# Patient Record
Sex: Male | Born: 1970 | Race: White | Hispanic: No | Marital: Married
Health system: Southern US, Community
[De-identification: ages and names within clinical notes are randomized; demographics above are authoritative.]

---

## 2017-08-01 ENCOUNTER — Inpatient Hospital Stay
Admission: RE | Admit: 2017-08-01 | Discharge: 2017-08-02 | Disposition: A | Discharge status: Still In Hospital | Payer: Medicaid Other | Source: Other Acute Inpatient Hospital | Attending: Internal Medicine | Admitting: Internal Medicine

## 2017-08-01 DIAGNOSIS — T85598A Other mechanical complication of other gastrointestinal prosthetic devices, implants and grafts, initial encounter: Secondary | ICD-10-CM

## 2017-08-01 DIAGNOSIS — R0603 Acute respiratory distress: Secondary | ICD-10-CM

## 2017-08-01 LAB — BLOOD GAS, ARTERIAL
Acid-Base Excess: 11.8 mmol/L — ABNORMAL HIGH (ref 0.0–2.0)
Bicarbonate: 38.1 mmol/L — ABNORMAL HIGH (ref 20.0–28.0)
FIO2: 30
O2 Saturation: 96 %
PEEP: 5 cmH2O
Patient temperature: 98.6
RATE: 18 resp/min
VT: 450 mL
pCO2 arterial: 75.3 mmHg (ref 32.0–48.0)
pH, Arterial: 7.325 — ABNORMAL LOW (ref 7.350–7.450)
pO2, Arterial: 87.7 mmHg (ref 83.0–108.0)

## 2017-08-02 ENCOUNTER — Other Ambulatory Visit (HOSPITAL_COMMUNITY): Payer: Medicaid Other

## 2017-08-02 ENCOUNTER — Inpatient Hospital Stay (HOSPITAL_COMMUNITY)
Admission: AD | Admit: 2017-08-02 | Discharge: 2017-08-11 | DRG: 907 | Disposition: E | Payer: Medicare Other | Source: Other Acute Inpatient Hospital | Attending: Pulmonary Disease | Admitting: Pulmonary Disease

## 2017-08-02 ENCOUNTER — Encounter: Payer: Medicaid Other | Admitting: Certified Registered"

## 2017-08-02 ENCOUNTER — Inpatient Hospital Stay (HOSPITAL_COMMUNITY): Payer: Medicare Other

## 2017-08-02 DIAGNOSIS — J9601 Acute respiratory failure with hypoxia: Secondary | ICD-10-CM | POA: Diagnosis not present

## 2017-08-02 DIAGNOSIS — R6521 Severe sepsis with septic shock: Secondary | ICD-10-CM | POA: Diagnosis not present

## 2017-08-02 DIAGNOSIS — Y828 Other medical devices associated with adverse incidents: Secondary | ICD-10-CM | POA: Diagnosis present

## 2017-08-02 DIAGNOSIS — Z66 Do not resuscitate: Secondary | ICD-10-CM | POA: Diagnosis not present

## 2017-08-02 DIAGNOSIS — G931 Anoxic brain damage, not elsewhere classified: Secondary | ICD-10-CM

## 2017-08-02 DIAGNOSIS — Z515 Encounter for palliative care: Secondary | ICD-10-CM | POA: Diagnosis not present

## 2017-08-02 DIAGNOSIS — L8990 Pressure ulcer of unspecified site, unspecified stage: Secondary | ICD-10-CM

## 2017-08-02 DIAGNOSIS — Z6841 Body Mass Index (BMI) 40.0 and over, adult: Secondary | ICD-10-CM | POA: Diagnosis not present

## 2017-08-02 DIAGNOSIS — T85628A Displacement of other specified internal prosthetic devices, implants and grafts, initial encounter: Principal | ICD-10-CM | POA: Diagnosis present

## 2017-08-02 DIAGNOSIS — A419 Sepsis, unspecified organism: Secondary | ICD-10-CM | POA: Diagnosis not present

## 2017-08-02 DIAGNOSIS — E119 Type 2 diabetes mellitus without complications: Secondary | ICD-10-CM | POA: Diagnosis present

## 2017-08-02 DIAGNOSIS — Z9289 Personal history of other medical treatment: Secondary | ICD-10-CM

## 2017-08-02 DIAGNOSIS — I2781 Cor pulmonale (chronic): Secondary | ICD-10-CM | POA: Diagnosis present

## 2017-08-02 DIAGNOSIS — N179 Acute kidney failure, unspecified: Secondary | ICD-10-CM | POA: Diagnosis not present

## 2017-08-02 DIAGNOSIS — E662 Morbid (severe) obesity with alveolar hypoventilation: Secondary | ICD-10-CM | POA: Diagnosis present

## 2017-08-02 DIAGNOSIS — J80 Acute respiratory distress syndrome: Secondary | ICD-10-CM | POA: Diagnosis present

## 2017-08-02 DIAGNOSIS — I110 Hypertensive heart disease with heart failure: Secondary | ICD-10-CM | POA: Diagnosis present

## 2017-08-02 DIAGNOSIS — E870 Hyperosmolality and hypernatremia: Secondary | ICD-10-CM | POA: Diagnosis not present

## 2017-08-02 DIAGNOSIS — T82538A Leakage of other cardiac and vascular devices and implants, initial encounter: Secondary | ICD-10-CM

## 2017-08-02 DIAGNOSIS — I469 Cardiac arrest, cause unspecified: Secondary | ICD-10-CM | POA: Diagnosis present

## 2017-08-02 DIAGNOSIS — Z930 Tracheostomy status: Secondary | ICD-10-CM

## 2017-08-02 DIAGNOSIS — J449 Chronic obstructive pulmonary disease, unspecified: Secondary | ICD-10-CM | POA: Diagnosis present

## 2017-08-02 DIAGNOSIS — I4891 Unspecified atrial fibrillation: Secondary | ICD-10-CM | POA: Diagnosis present

## 2017-08-02 DIAGNOSIS — E875 Hyperkalemia: Secondary | ICD-10-CM | POA: Diagnosis not present

## 2017-08-02 DIAGNOSIS — Z9911 Dependence on respirator [ventilator] status: Secondary | ICD-10-CM | POA: Diagnosis not present

## 2017-08-02 DIAGNOSIS — R570 Cardiogenic shock: Secondary | ICD-10-CM | POA: Diagnosis present

## 2017-08-02 DIAGNOSIS — J9600 Acute respiratory failure, unspecified whether with hypoxia or hypercapnia: Secondary | ICD-10-CM

## 2017-08-02 DIAGNOSIS — I509 Heart failure, unspecified: Secondary | ICD-10-CM | POA: Diagnosis present

## 2017-08-02 DIAGNOSIS — Z452 Encounter for adjustment and management of vascular access device: Secondary | ICD-10-CM

## 2017-08-02 LAB — COMPREHENSIVE METABOLIC PANEL
ALK PHOS: 96 U/L (ref 38–126)
ALT: 26 U/L (ref 17–63)
AST: 47 U/L — AB (ref 15–41)
Albumin: 2.9 g/dL — ABNORMAL LOW (ref 3.5–5.0)
Anion gap: 16 — ABNORMAL HIGH (ref 5–15)
BUN: 50 mg/dL — AB (ref 6–20)
CALCIUM: 9.2 mg/dL (ref 8.9–10.3)
CO2: 30 mmol/L (ref 22–32)
CREATININE: 1.39 mg/dL — AB (ref 0.61–1.24)
Chloride: 101 mmol/L (ref 101–111)
GFR calc non Af Amer: 59 mL/min — ABNORMAL LOW (ref >60)
Glucose, Bld: 299 mg/dL — ABNORMAL HIGH (ref 65–99)
Potassium: 5.6 mmol/L — ABNORMAL HIGH (ref 3.5–5.1)
SODIUM: 147 mmol/L — AB (ref 135–145)
Total Bilirubin: 1.5 mg/dL — ABNORMAL HIGH (ref 0.3–1.2)
Total Protein: 8.7 g/dL — ABNORMAL HIGH (ref 6.5–8.1)

## 2017-08-02 LAB — DIFFERENTIAL
BASOS ABS: 0 10*3/uL (ref 0.0–0.1)
Basophils Relative: 0 %
EOS PCT: 0 %
Eosinophils Absolute: 0 10*3/uL (ref 0.0–0.7)
LYMPHS ABS: 1.9 10*3/uL (ref 0.7–4.0)
Lymphocytes Relative: 10 %
MONOS PCT: 6 %
Monocytes Absolute: 1.1 10*3/uL — ABNORMAL HIGH (ref 0.1–1.0)
Neutro Abs: 16.1 10*3/uL — ABNORMAL HIGH (ref 1.7–7.7)
Neutrophils Relative %: 84 %

## 2017-08-02 LAB — BLOOD GAS, ARTERIAL
Acid-Base Excess: 12.6 mmol/L — ABNORMAL HIGH (ref 0.0–2.0)
BICARBONATE: 38.4 mmol/L — AB (ref 20.0–28.0)
FIO2: 0.3
LHR: 20 {breaths}/min
MECHVT: 500 mL
O2 Saturation: 92.4 %
PCO2 ART: 69.1 mmHg — AB (ref 32.0–48.0)
PEEP: 5 cmH2O
PO2 ART: 68.4 mmHg — AB (ref 83.0–108.0)
Patient temperature: 98.6
pH, Arterial: 7.364 (ref 7.350–7.450)

## 2017-08-02 LAB — POCT I-STAT, CHEM 8
BUN: 51 mg/dL — ABNORMAL HIGH (ref 6–20)
BUN: 50 mg/dL — ABNORMAL HIGH (ref 6–20)
BUN: 49 mg/dL — AB (ref 6–20)
CALCIUM ION: 1.16 mmol/L (ref 1.15–1.40)
CALCIUM ION: 1.15 mmol/L (ref 1.15–1.40)
CALCIUM ION: 1.15 mmol/L (ref 1.15–1.40)
CREATININE: 1.5 mg/dL — AB (ref 0.61–1.24)
Chloride: 101 mmol/L (ref 101–111)
Chloride: 101 mmol/L (ref 101–111)
Chloride: 101 mmol/L (ref 101–111)
Creatinine, Ser: 1.5 mg/dL — ABNORMAL HIGH (ref 0.61–1.24)
Creatinine, Ser: 1.5 mg/dL — ABNORMAL HIGH (ref 0.61–1.24)
GLUCOSE: 263 mg/dL — AB (ref 65–99)
GLUCOSE: 268 mg/dL — AB (ref 65–99)
GLUCOSE: 264 mg/dL — AB (ref 65–99)
HCT: 25 % — ABNORMAL LOW (ref 39.0–52.0)
HCT: 24 % — ABNORMAL LOW (ref 39.0–52.0)
HCT: 25 % — ABNORMAL LOW (ref 39.0–52.0)
HEMOGLOBIN: 8.5 g/dL — AB (ref 13.0–17.0)
HEMOGLOBIN: 8.2 g/dL — AB (ref 13.0–17.0)
Hemoglobin: 8.5 g/dL — ABNORMAL LOW (ref 13.0–17.0)
Potassium: 5.1 mmol/L (ref 3.5–5.1)
Potassium: 4.8 mmol/L (ref 3.5–5.1)
Potassium: 4.4 mmol/L (ref 3.5–5.1)
Sodium: 146 mmol/L — ABNORMAL HIGH (ref 135–145)
Sodium: 147 mmol/L — ABNORMAL HIGH (ref 135–145)
Sodium: 147 mmol/L — ABNORMAL HIGH (ref 135–145)
TCO2: 37 mmol/L — ABNORMAL HIGH (ref 22–32)
TCO2: 34 mmol/L — ABNORMAL HIGH (ref 22–32)
TCO2: 34 mmol/L — ABNORMAL HIGH (ref 22–32)

## 2017-08-02 LAB — BASIC METABOLIC PANEL
Anion gap: 11 (ref 5–15)
Anion gap: 12 (ref 5–15)
BUN: 11 mg/dL (ref 6–20)
BUN: 66 mg/dL — AB (ref 6–20)
CALCIUM: 8.7 mg/dL — AB (ref 8.9–10.3)
CO2: 22 mmol/L (ref 22–32)
CO2: 31 mmol/L (ref 22–32)
CREATININE: 0.63 mg/dL (ref 0.61–1.24)
Calcium: 8.8 mg/dL — ABNORMAL LOW (ref 8.9–10.3)
Chloride: 107 mmol/L (ref 101–111)
Chloride: 102 mmol/L (ref 101–111)
Creatinine, Ser: 1.64 mg/dL — ABNORMAL HIGH (ref 0.61–1.24)
GFR calc Af Amer: >60 mL/min (ref >60)
GFR calc Af Amer: 56 mL/min — ABNORMAL LOW (ref >60)
GFR, EST NON AFRICAN AMERICAN: 48 mL/min — AB (ref >60)
GLUCOSE: 92 mg/dL (ref 65–99)
GLUCOSE: 241 mg/dL — AB (ref 65–99)
POTASSIUM: 4.2 mmol/L (ref 3.5–5.1)
Potassium: 3.7 mmol/L (ref 3.5–5.1)
Sodium: 140 mmol/L (ref 135–145)
Sodium: 145 mmol/L (ref 135–145)

## 2017-08-02 LAB — CBC WITH DIFFERENTIAL/PLATELET
BAND NEUTROPHILS: 1 %
BASOS ABS: 0 10*3/uL (ref 0.0–0.1)
Basophils Relative: 0 %
Blasts: 0 %
EOS ABS: 0 10*3/uL (ref 0.0–0.7)
Eosinophils Relative: 0 %
HCT: 31.8 % — ABNORMAL LOW (ref 39.0–52.0)
HEMOGLOBIN: 8.2 g/dL — AB (ref 13.0–17.0)
Lymphocytes Relative: 16 %
Lymphs Abs: 3.2 10*3/uL (ref 0.7–4.0)
MCH: 25.7 pg — ABNORMAL LOW (ref 26.0–34.0)
MCHC: 25.8 g/dL — ABNORMAL LOW (ref 30.0–36.0)
MCV: 99.7 fL (ref 78.0–100.0)
METAMYELOCYTES PCT: 3 %
MONO ABS: 1 10*3/uL (ref 0.1–1.0)
MYELOCYTES: 3 %
Monocytes Relative: 5 %
NEUTROS PCT: 67 %
NRBC: 0 /100{WBCs}
Neutro Abs: 15 10*3/uL — ABNORMAL HIGH (ref 1.7–7.7)
Other: 5 %
PLATELETS: UNDETERMINED 10*3/uL (ref 150–400)
PROMYELOCYTES ABS: 0 %
RBC: 3.19 MIL/uL — ABNORMAL LOW (ref 4.22–5.81)
RDW: 17.2 % — ABNORMAL HIGH (ref 11.5–15.5)
WBC: 20.3 10*3/uL — ABNORMAL HIGH (ref 4.0–10.5)

## 2017-08-02 LAB — GLUCOSE, CAPILLARY
GLUCOSE-CAPILLARY: 254 mg/dL — AB (ref 65–99)
GLUCOSE-CAPILLARY: 245 mg/dL — AB (ref 65–99)
GLUCOSE-CAPILLARY: 220 mg/dL — AB (ref 65–99)
GLUCOSE-CAPILLARY: 213 mg/dL — AB (ref 65–99)
Glucose-Capillary: 201 mg/dL — ABNORMAL HIGH (ref 65–99)
Glucose-Capillary: 222 mg/dL — ABNORMAL HIGH (ref 65–99)
Glucose-Capillary: 223 mg/dL — ABNORMAL HIGH (ref 65–99)
Glucose-Capillary: 224 mg/dL — ABNORMAL HIGH (ref 65–99)
Glucose-Capillary: 234 mg/dL — ABNORMAL HIGH (ref 65–99)
Glucose-Capillary: 234 mg/dL — ABNORMAL HIGH (ref 65–99)
Glucose-Capillary: 249 mg/dL — ABNORMAL HIGH (ref 65–99)

## 2017-08-02 LAB — URINALYSIS, ROUTINE W REFLEX MICROSCOPIC
GLUCOSE, UA: NEGATIVE mg/dL
Ketones, ur: 15 mg/dL — AB
Nitrite: NEGATIVE
PROTEIN: 100 mg/dL — AB
SPECIFIC GRAVITY, URINE: 1.02 (ref 1.005–1.030)
pH: 5.5 (ref 5.0–8.0)

## 2017-08-02 LAB — POCT I-STAT 4, (NA,K, GLUC, HGB,HCT)
GLUCOSE: 245 mg/dL — AB (ref 65–99)
HEMATOCRIT: 26 % — AB (ref 39.0–52.0)
HEMOGLOBIN: 8.8 g/dL — AB (ref 13.0–17.0)
POTASSIUM: 5.2 mmol/L — AB (ref 3.5–5.1)
SODIUM: 148 mmol/L — AB (ref 135–145)

## 2017-08-02 LAB — POCT I-STAT 3, ART BLOOD GAS (G3+)
Acid-Base Excess: 10 mmol/L — ABNORMAL HIGH (ref 0.0–2.0)
BICARBONATE: 38.9 mmol/L — AB (ref 20.0–28.0)
O2 Saturation: 99 %
PCO2 ART: 86.8 mmHg — AB (ref 32.0–48.0)
PO2 ART: 158 mmHg — AB (ref 83.0–108.0)
Patient temperature: 36.1
TCO2: 42 mmol/L — ABNORMAL HIGH (ref 22–32)
pH, Arterial: 7.255 — ABNORMAL LOW (ref 7.350–7.450)

## 2017-08-02 LAB — CBC
HCT: 28.9 % — ABNORMAL LOW (ref 39.0–52.0)
Hemoglobin: 7.8 g/dL — ABNORMAL LOW (ref 13.0–17.0)
MCH: 26.2 pg (ref 26.0–34.0)
MCHC: 27 g/dL — ABNORMAL LOW (ref 30.0–36.0)
MCV: 97 fL (ref 78.0–100.0)
Platelets: 116 10*3/uL — ABNORMAL LOW (ref 150–400)
RBC: 2.98 MIL/uL — ABNORMAL LOW (ref 4.22–5.81)
RDW: 17.2 % — AB (ref 11.5–15.5)
WBC: 19.1 10*3/uL — ABNORMAL HIGH (ref 4.0–10.5)

## 2017-08-02 LAB — APTT
APTT: 28 s (ref 24–36)
aPTT: 30 seconds (ref 24–36)

## 2017-08-02 LAB — URINALYSIS, MICROSCOPIC (REFLEX)

## 2017-08-02 LAB — PROTIME-INR
INR: 1.43
INR: 1.46
INR: 1.6
PROTHROMBIN TIME: 17.3 s — AB (ref 11.4–15.2)
PROTHROMBIN TIME: 18.9 s — AB (ref 11.4–15.2)
Prothrombin Time: 17.6 seconds — ABNORMAL HIGH (ref 11.4–15.2)

## 2017-08-02 LAB — LACTIC ACID, PLASMA: LACTIC ACID, VENOUS: 3.1 mmol/L — AB (ref 0.5–1.9)

## 2017-08-02 LAB — CK TOTAL AND CKMB (NOT AT ARMC)
CK, MB: 3.7 ng/mL (ref 0.5–5.0)
Relative Index: INVALID (ref 0.0–2.5)
Total CK: 67 U/L (ref 49–397)

## 2017-08-02 LAB — TROPONIN I
TROPONIN I: 0.1 ng/mL — AB (ref <0.03)
Troponin I: 0.06 ng/mL (ref <0.03)

## 2017-08-02 LAB — HIV ANTIBODY (ROUTINE TESTING W REFLEX): HIV SCREEN 4TH GENERATION: NONREACTIVE

## 2017-08-02 LAB — MRSA PCR SCREENING: MRSA BY PCR: NEGATIVE

## 2017-08-02 LAB — HEMOGLOBIN A1C
Hgb A1c MFr Bld: 6.1 % — ABNORMAL HIGH (ref 4.8–5.6)
MEAN PLASMA GLUCOSE: 128.37 mg/dL

## 2017-08-02 LAB — MAGNESIUM: MAGNESIUM: 3.5 mg/dL — AB (ref 1.7–2.4)

## 2017-08-02 MED ORDER — INSULIN LISPRO 100 UNIT/ML ~~LOC~~ SOLN
SUBCUTANEOUS | Status: DC
Start: 2017-08-01 — End: 2017-08-02

## 2017-08-02 MED ORDER — FENTANYL CITRATE (PF) 2500 MCG/50ML IJ SOLN
INTRAMUSCULAR | Status: DC
Start: ? — End: 2017-08-02

## 2017-08-02 MED ORDER — GENERIC EXTERNAL MEDICATION
Status: DC
Start: ? — End: 2017-08-02

## 2017-08-02 MED ORDER — SODIUM CHLORIDE 0.9 % IV SOLN
INTRAVENOUS | Status: DC
  Administered 2017-08-02 (×2): via INTRAVENOUS

## 2017-08-02 MED ORDER — SODIUM CHLORIDE 0.9% FLUSH
10.0000 mL | Freq: Two times a day (BID) | INTRAVENOUS | Status: DC
  Administered 2017-08-03 – 2017-08-06 (×6): 10 mL

## 2017-08-02 MED ORDER — ACETAMINOPHEN 160 MG/5ML PO LIQD
650.00 | ORAL | Status: DC
Start: ? — End: 2017-08-02

## 2017-08-02 MED ORDER — POTASSIUM CHLORIDE 20 MEQ/100ML IV SOLN
INTRAVENOUS | Status: DC
Start: ? — End: 2017-08-02

## 2017-08-02 MED ORDER — SODIUM CHLORIDE 0.9 % IV SOLN
INTRAVENOUS | Status: DC
  Administered 2017-08-02: 2 [IU]/h via INTRAVENOUS
  Administered 2017-08-02: 14.7 [IU]/h via INTRAVENOUS
  Administered 2017-08-03: 13.1 [IU]/h via INTRAVENOUS
  Filled 2017-08-02 (×3): qty 1

## 2017-08-02 MED ORDER — HEPARIN SODIUM (PORCINE) 5000 UNIT/ML IJ SOLN
5000.0000 [IU] | Freq: Three times a day (TID) | INTRAMUSCULAR | Status: DC
  Administered 2017-08-02 – 2017-08-06 (×13): 5000 [IU] via SUBCUTANEOUS
  Filled 2017-08-02 (×13): qty 1

## 2017-08-02 MED ORDER — ACETAMINOPHEN 325 MG PO TABS
650.00 | ORAL_TABLET | ORAL | Status: DC
Start: ? — End: 2017-08-02

## 2017-08-02 MED ORDER — MIDAZOLAM HCL 2 MG/2ML IJ SOLN: 1.0000 mg | Freq: Once | INTRAMUSCULAR | Status: AC

## 2017-08-02 MED ORDER — MIDAZOLAM HCL 2 MG/2ML IJ SOLN
2.0000 mg | Freq: Once | INTRAMUSCULAR | Status: AC
  Administered 2017-08-02: 2 mg via INTRAVENOUS

## 2017-08-02 MED ORDER — OXYCODONE HCL 5 MG PO TABS
5.00 | ORAL_TABLET | ORAL | Status: DC
Start: ? — End: 2017-08-02

## 2017-08-02 MED ORDER — CISATRACURIUM BOLUS VIA INFUSION
0.1000 mg/kg | Freq: Once | INTRAVENOUS | Status: AC
  Administered 2017-08-02: 13.5 mg via INTRAVENOUS
  Filled 2017-08-02: qty 14

## 2017-08-02 MED ORDER — FENTANYL CITRATE (PF) 100 MCG/2ML IJ SOLN
100.0000 ug | Freq: Once | INTRAMUSCULAR | Status: AC
  Administered 2017-08-02: 100 ug via INTRAVENOUS

## 2017-08-02 MED ORDER — BUDESONIDE 0.5 MG/2ML IN SUSP
0.5000 mg | Freq: Two times a day (BID) | RESPIRATORY_TRACT | Status: DC
  Administered 2017-08-02 – 2017-08-06 (×9): 0.5 mg via RESPIRATORY_TRACT
  Filled 2017-08-02 (×9): qty 2

## 2017-08-02 MED ORDER — INSULIN GLARGINE 100 UNIT/ML ~~LOC~~ SOLN
SUBCUTANEOUS | Status: DC
Start: ? — End: 2017-08-02

## 2017-08-02 MED ORDER — FENTANYL CITRATE (PF) 100 MCG/2ML IJ SOLN: 50.0000 ug | Freq: Once | INTRAMUSCULAR | Status: AC

## 2017-08-02 MED ORDER — ARTIFICIAL TEARS OPHTHALMIC OINT
1.0000 "application " | TOPICAL_OINTMENT | Freq: Three times a day (TID) | OPHTHALMIC | Status: DC
  Administered 2017-08-02 – 2017-08-03 (×5): 1 via OPHTHALMIC
  Filled 2017-08-02: qty 3.5

## 2017-08-02 MED ORDER — FENTANYL BOLUS VIA INFUSION
25.0000 ug | INTRAVENOUS | Status: DC | PRN
  Filled 2017-08-02: qty 25

## 2017-08-02 MED ORDER — INSULIN ASPART 100 UNIT/ML ~~LOC~~ SOLN: 0.0000 [IU] | SUBCUTANEOUS | Status: DC

## 2017-08-02 MED ORDER — IPRATROPIUM-ALBUTEROL 0.5-2.5 (3) MG/3ML IN SOLN
3.0000 mL | Freq: Four times a day (QID) | RESPIRATORY_TRACT | Status: DC
  Administered 2017-08-02 – 2017-08-06 (×18): 3 mL via RESPIRATORY_TRACT
  Filled 2017-08-02 (×18): qty 3

## 2017-08-02 MED ORDER — PANTOPRAZOLE SODIUM 40 MG IV SOLR
40.0000 mg | Freq: Every day | INTRAVENOUS | Status: DC
  Administered 2017-08-02 – 2017-08-05 (×4): 40 mg via INTRAVENOUS
  Filled 2017-08-02 (×4): qty 40

## 2017-08-02 MED ORDER — ACETAMINOPHEN 650 MG RE SUPP
650.00 | RECTAL | Status: DC
Start: ? — End: 2017-08-02

## 2017-08-02 MED ORDER — FENTANYL CITRATE-NACL 2.5-0.9 MG/250ML-% IV SOLN
INTRAVENOUS | Status: DC
Start: ? — End: 2017-08-02

## 2017-08-02 MED ORDER — CHLORHEXIDINE GLUCONATE CLOTH 2 % EX PADS
6.0000 | MEDICATED_PAD | Freq: Every day | CUTANEOUS | Status: DC
  Administered 2017-08-02 – 2017-08-06 (×3): 6 via TOPICAL

## 2017-08-02 MED ORDER — INSULIN LISPRO 100 UNIT/ML ~~LOC~~ SOLN
SUBCUTANEOUS | Status: DC
Start: ? — End: 2017-08-02

## 2017-08-02 MED ORDER — SODIUM CHLORIDE 0.9 % IV SOLN
2.0000 mg/h | INTRAVENOUS | Status: DC
  Administered 2017-08-02: 7 mg/h via INTRAVENOUS
  Administered 2017-08-02: 2 mg/h via INTRAVENOUS
  Administered 2017-08-02 – 2017-08-03 (×4): 7 mg/h via INTRAVENOUS
  Filled 2017-08-02 (×7): qty 10

## 2017-08-02 MED ORDER — SODIUM CHLORIDE 0.9 % IV SOLN
100.0000 ug/h | INTRAVENOUS | Status: DC
  Filled 2017-08-02: qty 50

## 2017-08-02 MED ORDER — FISH OIL 1000 MG PO CAPS
ORAL_CAPSULE | ORAL | Status: DC
Start: 2017-08-01 — End: 2017-08-02

## 2017-08-02 MED ORDER — CLINDAMYCIN PHOSPHATE IN D5W 900 MG/50ML IV SOLN
900.00 | INTRAVENOUS | Status: DC
Start: ? — End: 2017-08-02

## 2017-08-02 MED ORDER — BISACODYL 10 MG RE SUPP
10.00 | RECTAL | Status: DC
Start: ? — End: 2017-08-02

## 2017-08-02 MED ORDER — FENTANYL 2500MCG IN NS 250ML (10MCG/ML) PREMIX INFUSION
100.0000 ug/h | INTRAVENOUS | Status: DC
  Administered 2017-08-02: 300 ug/h via INTRAVENOUS
  Administered 2017-08-02: 100 ug/h via INTRAVENOUS
  Administered 2017-08-03 (×3): 300 ug/h via INTRAVENOUS
  Filled 2017-08-02 (×6): qty 250

## 2017-08-02 MED ORDER — SODIUM CHLORIDE 0.9 % IV SOLN
1250.0000 mg | Freq: Two times a day (BID) | INTRAVENOUS | Status: DC
  Administered 2017-08-03: 1250 mg via INTRAVENOUS
  Filled 2017-08-02 (×2): qty 1250

## 2017-08-02 MED ORDER — GENERIC EXTERNAL MEDICATION
.04 | Status: DC
Start: ? — End: 2017-08-02

## 2017-08-02 MED ORDER — NITROGLYCERIN 0.4 MG SL SUBL
0.40 | SUBLINGUAL_TABLET | SUBLINGUAL | Status: DC
Start: ? — End: 2017-08-02

## 2017-08-02 MED ORDER — SODIUM CHLORIDE 0.9 % IV SOLN
1.0000 ug/kg/min | INTRAVENOUS | Status: DC
  Administered 2017-08-02 (×2): 1 ug/kg/min via INTRAVENOUS
  Administered 2017-08-03: 1.5 ug/kg/min via INTRAVENOUS
  Filled 2017-08-02 (×4): qty 20

## 2017-08-02 MED ORDER — VANCOMYCIN HCL 10 G IV SOLR
2500.0000 mg | Freq: Once | INTRAVENOUS | Status: AC
  Administered 2017-08-02: 2500 mg via INTRAVENOUS
  Filled 2017-08-02: qty 2500

## 2017-08-02 MED ORDER — MEROPENEM 1 G IV SOLR
2.0000 g | Freq: Three times a day (TID) | INTRAVENOUS | Status: DC
  Administered 2017-08-02 – 2017-08-05 (×9): 2 g via INTRAVENOUS
  Filled 2017-08-02 (×10): qty 2

## 2017-08-02 MED ORDER — ETOMIDATE 2 MG/ML IV SOLN
20.0000 mg | Freq: Once | INTRAVENOUS | Status: AC
  Administered 2017-08-02: 20 mg via INTRAVENOUS

## 2017-08-02 MED ORDER — ROCURONIUM BROMIDE 50 MG/5ML IV SOLN
50.0000 mg | Freq: Once | INTRAVENOUS | Status: AC
  Administered 2017-08-02: 50 mg via INTRAVENOUS
  Filled 2017-08-02: qty 5

## 2017-08-02 MED ORDER — POTASSIUM CHLORIDE CRYS ER 20 MEQ PO TBCR
EXTENDED_RELEASE_TABLET | ORAL | Status: DC
Start: ? — End: 2017-08-02

## 2017-08-02 MED ORDER — ALBUTEROL SULFATE HFA 108 (90 BASE) MCG/ACT IN AERS
INHALATION_SPRAY | RESPIRATORY_TRACT | Status: DC
Start: 2017-08-01 — End: 2017-08-02

## 2017-08-02 MED ORDER — SODIUM CHLORIDE 0.9% FLUSH: 10.0000 mL | INTRAVENOUS | Status: DC | PRN

## 2017-08-02 MED ORDER — ORAL CARE MOUTH RINSE
15.0000 mL | OROMUCOSAL | Status: DC
  Administered 2017-08-02 – 2017-08-06 (×41): 15 mL via OROMUCOSAL

## 2017-08-02 MED ORDER — CHLORHEXIDINE GLUCONATE 0.12% ORAL RINSE (MEDLINE KIT)
15.0000 mL | Freq: Two times a day (BID) | OROMUCOSAL | Status: DC
  Administered 2017-08-02 – 2017-08-06 (×9): 15 mL via OROMUCOSAL

## 2017-08-02 MED ORDER — MIDAZOLAM BOLUS VIA INFUSION
1.0000 mg | INTRAVENOUS | Status: DC | PRN
  Filled 2017-08-02: qty 1

## 2017-08-02 MED ORDER — NOREPINEPHRINE BITARTRATE 1 MG/ML IV SOLN
0.0000 ug/min | INTRAVENOUS | Status: DC
  Administered 2017-08-02: 10 ug/min via INTRAVENOUS
  Administered 2017-08-02: 5 ug/min via INTRAVENOUS
  Administered 2017-08-03: 13 ug/min via INTRAVENOUS
  Filled 2017-08-02 (×3): qty 4

## 2017-08-02 MED ORDER — GENERIC EXTERNAL MEDICATION
.08 | Status: DC
Start: ? — End: 2017-08-02

## 2017-08-02 MED ORDER — CISATRACURIUM BOLUS VIA INFUSION
0.0500 mg/kg | INTRAVENOUS | Status: DC | PRN
  Filled 2017-08-02: qty 7

## 2017-08-02 MED ORDER — DILTIAZEM HCL 30 MG PO TABS
30.00 | ORAL_TABLET | ORAL | Status: DC
Start: 2017-08-01 — End: 2017-08-02

## 2017-08-02 MED ORDER — SODIUM CHLORIDE 0.9 % IV SOLN
INTRAVENOUS | Status: DC
Start: ? — End: 2017-08-02

## 2017-08-02 MED ORDER — INSULIN ASPART 100 UNIT/ML ~~LOC~~ SOLN
0.0000 [IU] | SUBCUTANEOUS | Status: DC
  Administered 2017-08-02: 5 [IU] via SUBCUTANEOUS

## 2017-08-02 MED ORDER — POTASSIUM CHLORIDE 20 MEQ/15ML (10%) PO SOLN
ORAL | Status: DC
Start: ? — End: 2017-08-02

## 2017-08-02 MED FILL — Medication: Qty: 1 | Status: AC

## 2017-08-02 NOTE — Procedures (Signed)
Bronchoscopy Procedure Note Duane Mcmahon [id number] [birth year]  Procedure: Bronchoscopy Indications: Diagnostic evaluation of the airways, Obtain specimens for culture and/or other diagnostic studies and Remove secretions  Procedure Details Consent: Unable to obtain consent because of emergent medical necessity. Time Out: Verified patient identification, verified procedure, site/side was marked, verified correct patient position, special equipment/implants available, medications/allergies/relevent history reviewed, required imaging and test results available.  Performed  In preparation for procedure, patient was given 100% FiO2 and bronchoscope lubricated. Sedation: Benzodiazepines, Muscle relaxants, Etomidate and Fentanyl  Airway entered and the following bronchi were examined: RUL, RML, RLL, LUL, LLL and Bronchi.   BAL from RML Bronchoscope removed.  , Patient placed back on 100% FiO2 at conclusion of procedure.    Evaluation Hemodynamic Status: BP stable throughout; O2 sats: stable throughout Patient's Current Condition: stable Specimens:  Sent purulent fluid Complications: No apparent complications Patient did tolerate procedure well.   [name] 07/25/2017

## 2017-08-02 NOTE — Procedures (Signed)
ELECTROENCEPHALOGRAM REPORT  Date of Study: 07/27/2017  Patient's Name: Duane Mcmahon MRN: [id number] Date of Birth: [birth year]  Referring Provider: Allyson SabalWesam Yocoub, MD  Clinical History: 47 year old male status post cardiac arrest, undergoing hypothermia protocol  Medications: Fentanyl Versed Nimbex  Technical Summary: A multichannel digital EEG recording measured by the international 10-20 system with electrodes applied with paste and impedances below 5000 ohms performed in our laboratory with EKG monitoring in an intubated and sedated patient.  Hyperventilation and photic stimulation were not performed.  The digital EEG was referentially recorded, reformatted, and digitally filtered in a variety of bipolar and referential montages for optimal display.    Description: The patient is intubated and unresponsive, undergoing hypothermia protocol with versed and fentanyl sedation during the recording. There is loss of normal background activity. The records read at a sensitivity of 3 uV/mm shows diffuse suppression of background activity. There is no spontaneous reactivity or reactivity noted with noxious stimulation. There is muscle artifact over the frontal leads. Hyperventilation and photic stimulation were not performed. There were no epileptiform discharges or electrographic seizures seen.   EKG lead was unremarkable.  Impression: This EEG is markedly abnormal due to diffuse background suppression and lack of EEG reactivity with noxious stimulation.  Clinical Correlation of the above findings indicates severe diffuse cerebral dysfunction that is non-specific in etiology and can be seen in the setting of anoxic/ischemic injury, toxic/metabolic encephalopathies, or medication effect. Clinical correlation is advised.  Shon MilletAdam Kyria Bumgardner, DO

## 2017-08-02 NOTE — H&P (Signed)
PULMONARY / CRITICAL CARE MEDICINE   Name: Duane Mcmahon MRN: [id number] DOB: Oct 15, 1970    ADMISSION DATE:  07/20/2017 CONSULTATION DATE:  Direct admit from [facility]  REFERRING MD:  Dr. Bess Harvest, [facility]  CHIEF COMPLAINT:  Cardiac Arrest   HISTORY OF PRESENT ILLNESS:   HPI obtained from medical chart review as patient is acutely encephalopathic and unable to provide history.    47 [facility] for chronic respiratory failure with hypoxia and hypercarbia, tracheostomy placement, and ventilator dependency.    Has a past medical history of morbid obesity, OSA/OHS, COPD, IDDM, HTN, Afib with RVR, and was a difficult airway at OSH.  During the morning of 1/22, patient had increasing agitation and was given Ativan.  Shortly afterwards he became difficult to ventilate and noted that his 8 XLT trach became dislodged, and patient subsequently developed bradycardia changing to PEA/ asystole.  CPR provided between 0548 - 0611 with ACLS measures with conversion to sinus tachycardia.  His tracheotomy was unable to be placed back.  A 6.5 ETT was placed in his stoma.  After ROSC, patient remained unresponsive.  PCCM called for direct admit to ICU following cardiac arrest.    Prior to cardiac arrest, patient was on fentanyl drip at 100 mcg/hr and cardizem drip at 10 mg/hr.  His baseline mental status was alert and able to follow commands and move all extremities.  His ventilator settings were AC 500/ 20/ 40%/5.    PAST MEDICAL HISTORY :  He  has no past medical history on file.  PAST SURGICAL HISTORY: He  has no past surgical history on file.  Allergies not on file  No current facility-administered medications on file prior to encounter.    No current outpatient medications on file prior to encounter.    FAMILY HISTORY:  His has no family status information on file.    SOCIAL HISTORY: He    REVIEW OF  SYSTEMS:  Unable as patient is encephalopathic and intubated  SUBJECTIVE:    VITAL SIGNS: There were no vitals taken for this visit.  HEMODYNAMICS:    VENTILATOR SETTINGS:    INTAKE / OUTPUT: No intake/output data recorded.  PHYSICAL EXAMINATION: General: 47 year old obese male with endotracheal tube via stoma unresponsive. Neuro: Nonresponsive grimaces to noxious stimuli HEENT: #6 endotracheal tube via his stoma that tracheostomy is 07/28/2088 Cardiovascular: Heart sounds are regular with distant cardiac sinus tach history Lungs: Decreased breath sound is present Abdomen: Morbidly obese bowel sounds Musculoskeletal: Extremities are with chronic stasis Skin: Dry.  Cool  LABS:  BMET No results for input(s): NA, K, CL, CO2, BUN, CREATININE, GLUCOSE in the last 168 hours.  Electrolytes No results for input(s): CALCIUM, MG, PHOS in the last 168 hours.  CBC No results for input(s): WBC, HGB, HCT, PLT in the last 168 hours.  Coag's No results for input(s): APTT, INR in the last 168 hours.  Sepsis Markers No results for input(s): LATICACIDVEN, PROCALCITON, O2SATVEN in the last 168 hours.  ABG Recent Labs  Lab 08/01/17 2245 07/20/2017 0310  PHART 7.325* 7.364  PCO2ART 75.3* 69.1*  PO2ART 87.7 68.4*    Liver Enzymes No results for input(s): AST, ALT, ALKPHOS, BILITOT, ALBUMIN in the last 168 hours.  Cardiac Enzymes No results for input(s): TROPONINI, PROBNP in the last 168 hours.  Glucose No results for input(s): GLUCAP in the last 168 hours.  Imaging Dg Abd Portable 1v  Result Date: 07/30/2017 CLINICAL DATA:  NG tube placement EXAM: PORTABLE ABDOMEN - 1 VIEW COMPARISON:  None. FINDINGS: Enteric tube tip is in the right upper quadrant consistent with location in the distal stomach. An inferior vena caval filter is present. Gas-filled nondistended small and large bowel suggesting ileus. IMPRESSION: Enteric tube tip in the right upper quadrant consistent with  location in the distal stomach. Electronically Signed   By: Burman NievesWilliam  Stevens M.D.   On: 2018-06-15 01:13     STUDIES:    CULTURES:   ANTIBIOTICS:   SIGNIFICANT EVENTS: [facility] for OHS with chronic resp failure 1/23 cardiac arrest 23 mins after trach coming dislodged.   LINES/TUBES: 8 XLT preadmit > 1/18 (from outside facility)>>>1/23>>>1/23 (JY)>>> ETT (via trach stoma) 1/23 >>>1/23   DISCUSSION: 47 year old super morbidly obese male with history of chronic respiratory failure secondary to OHS. He required tracheostomy for this and was [facility]. The following day he suffered a cardiac arrest after his trach had become dislodged. PEA arrest for 23 minutes. He was transferred to [facility].  ASSESSMENT / PLAN:  PULMONARY A: Acute on chronic hypoxic/hypercarbic respiratory failure COPD without acute exacerbation  P:   Full vent support VAP bundle ABG serial  CARDIOVASCULAR A:  PEA arrest: likely primary respiratory arrest Atrial fibrillation with RVR CHF reported from recent admission. Unable to  H/o HTN  P:  Telemetry monitoring TTM 33 C MAP goal 80 Holding diltiazem infusion  RENAL A:   No known issues  P:   STAT BMP I&O  GASTROINTESTINAL A:   Nutrition  P:   Hold TF for now IV protonix  HEMATOLOGIC A:   No known issues  P:  Follow CBC SQ heparin for VTE ppx  INFECTIOUS A:   No known issues  P:   Follow WBC and fever curve  ENDOCRINE A:   Insulin dependent diabetes mellitus   P:   CBG monitoring and SSI  NEUROLOGIC A:   Acute anoxic encephalopathy  P:   RASS goal: -4 to -5 while cool Versed and fentanyl infusions for sedation Low dose nimbex for shivering.  EEG   FAMILY  - Updates:   - Inter-disciplinary family meet or Palliative Care meeting due by:  1/30  Joneen RoachPaul Hoffman, AGACNP-BC [place] [facility]/Critical  Care Pager [phone] or [phone]  2018-01-31 8:23 AM  Attending Note:  47 year old male that is morbidly obese who [facility] after a cardiac arrest due to loss of his trach.  On exam, he is unresponsive with decreased BS diffusely.  I reviewed CXR myself, ETT is 6 cm in the right main.  ETT was removed after intubation orally then trach was placed over the bronchoscopy.  No family bedside.  Will begin hypothermia (patient is completely unresponsive that is morbidly obese and I doubt CPR was remotely effective).  Change ETT for a stable airway.  Broad spectrum abx.  EEG.  CT of the brain.  Will update family when available.  The patient is critically ill with multiple organ systems failure and requires high complexity decision making for assessment and support, frequent evaluation and titration of therapies, application of advanced monitoring technologies and extensive interpretation of multiple databases.   Critical Care Time devoted to patient care services described in this note is  110 Minutes. This time reflects time of care of this signee Dr Koren BoundWesam Chelsee Hosie. This critical care time does not reflect procedure time, or teaching  time or supervisory time of PA/NP/Med student/Med Resident etc but could involve care discussion time.  Rush Farmer, M.D. [facility] Pulmonary/Critical Care Medicine. Pager: [phone]. After hours pager: [phone].

## 2017-08-02 NOTE — Progress Notes (Signed)
RT note- Patient was [facility] with 6.5 ETT in his stoma, sp02 noted at 74%, patient was emergently re intubated then new #8.0 xlt proximal placed, and secured. BBS equal and patient was Bronched, bloody secretions sent to lab.

## 2017-08-02 NOTE — Progress Notes (Signed)
Pharmacy Antibiotic Note  Duane Mcmahon is a 47 y.o. male admitted on 08/08/2017 with pneumonia.  Pharmacy has been consulted for vancomycin and meropenem dosing.  Patient admitted from [facility] after recent discharge from [facility] for chronic resp failure s/p trach placement. Underwent PEA/asystole arrest today w/ 20 min ROSC (also in setting of dislodged trach). Has listed unknown allergy to PCN - unable to verify. Renal function is slightly elevated (Scr 1.32, normCrCl ~66 mL/min).   Plan: Meropenem 2 g IV every 8 hours Vancomycin 2500 mg IV once Vancomycin 1250 mg IV every 12 hours.  Goal trough 15-20 mcg/mL. Monitor renal fx, cx results, clinical picture, and VT as needed  Height: _0  (172.7 cm) Weight: (!) 519 lb (235.4 kg) IBW/kg (Calculated) : 68.4  No data recorded.  Recent Labs  Lab 07/28/2017 0615  WBC 20.3*  CREATININE 1.39*    Estimated Creatinine Clearance: 124.3 mL/min (A) (by C-G formula based on SCr of 1.39 mg/dL (H)).    Allergies  Allergen Reactions  . Gabapentin   . Penicillins     Antimicrobials this admission: Vancomycin 1/23 >>  Meropenem 1/23 >>   Dose adjustments this admission: N/A  Microbiology results: 1/23 BCx: sent 1/23 UCx: sent  1/23 Sputum (BAL): sent   Thank you for allowing pharmacy to be a part of this patient's care.  Doylene Canard, PharmD Clinical Pharmacist  Pager: [phone] Clinical Phone for 07/15/2017 until 3:30pm: [phone] If after 3:30pm, please call main pharmacy at [phone] 07/18/2017 8:56 AM

## 2017-08-02 NOTE — Progress Notes (Signed)
Lactic Acid, Glucose, and Potassium results reported to MD Molli KnockYacoub. New orders received.   Patient weight discussed with pharmacy for medication dosing, pharmacy reviewed medication orders and is not adjusting at this time.  If patient assessment warrants adjustment, RN will discuss with pharmacist at that time.

## 2017-08-02 NOTE — Procedures (Signed)
Emergent Bedside Trach Placement  Patient lost his airway and coded in [facility], brought down to the ICU with a size 6 ETT in the right mainstem bronchus.  ETT was positioned and patient was dilated and retrached with a size 8 XLT trach that was cuffed after being intubated from above, sedated and paralyzed.  Verified bronchoscopically.  Alyson ReedyWesam G. Kensley Valladares, M.D. [facility] Pulmonary/Critical Care Medicine. Pager: [phone]. After hours pager: [phone].

## 2017-08-02 NOTE — Progress Notes (Signed)
EEG completed, results pending. 

## 2017-08-02 NOTE — Progress Notes (Signed)
Patient transported to CT and back to room 2H09 without complications.  

## 2017-08-02 NOTE — Procedures (Signed)
Arterial Catheter Insertion Procedure Note Duane Mcmahon [id number] 1971-01-04  Procedure: Insertion of Arterial Catheter  Indications: Blood pressure monitoring and Frequent blood sampling  Procedure Details Consent: Unable to obtain consent because of emergent medical necessity. Time Out: Verified patient identification, verified procedure, site/side was marked, verified correct patient position, special equipment/implants available, medications/allergies/relevent history reviewed, required imaging and test results available.  Performed  Maximum sterile technique was used including antiseptics, cap, gloves, gown, hand hygiene, mask and sheet. Skin prep: Chlorhexidine; local anesthetic administered 20 gauge catheter was inserted into right radial artery using the Seldinger technique.  Evaluation Blood flow good; BP tracing good. Complications: No apparent complications.   Duane BoundYACOUB,Duane Mcmahon Jun 20, 2018

## 2017-08-02 NOTE — Procedures (Signed)
Central Venous Catheter Insertion Procedure Note Duane AbbottScott Mcmahon [id number] Sep 06, 1970  Procedure: Insertion of Central Venous Catheter Indications: Assessment of intravascular volume, Drug and/or fluid administration and Frequent blood sampling  Procedure Details Consent: Unable to obtain consent because of emergent medical necessity. Time Out: Verified patient identification, verified procedure, site/side was marked, verified correct patient position, special equipment/implants available, medications/allergies/relevent history reviewed, required imaging and test results available.  Performed  Maximum sterile technique was used including antiseptics, cap, gloves, gown, hand hygiene, mask and sheet. Skin prep: Chlorhexidine; local anesthetic administered A antimicrobial bonded/coated triple lumen catheter was placed in the left internal jugular vein using the Seldinger technique.  Evaluation Blood flow good Complications: No apparent complications Patient did tolerate procedure well. Chest X-ray ordered to verify placement.  CXR: pending.  U/S used in placement  Duane Mcmahon 08/09/2017, 9:11 AM

## 2017-08-02 NOTE — Procedures (Signed)
Intubation Procedure Note Duane Mcmahon [id number] Aug 18, 1970  Procedure: Intubation Indications: Respiratory insufficiency  Procedure Details Consent: Unable to obtain consent because of emergent medical necessity. Time Out: Verified patient identification, verified procedure, site/side was marked, verified correct patient position, special equipment/implants available, medications/allergies/relevent history reviewed, required imaging and test results available.  Performed  Maximum sterile technique was used including gloves, hand hygiene and mask.  MAC    Evaluation Hemodynamic Status: BP stable throughout; O2 sats: stable throughout Patient's Current Condition: stable Complications: No apparent complications Patient did tolerate procedure well. Chest X-ray ordered to verify placement.  CXR: pending.   [name] 07/31/2017

## 2017-08-02 NOTE — Progress Notes (Signed)
Spoke with family, updated, upset with events but understand events.  After discussion.  LCB with no CPR/cardioversion, proceed with cooling.  But if continues to be dependent on vent or persistent vegetative then will likely proceed with comfort care.  The patient is critically ill with multiple organ systems failure and requires high complexity decision making for assessment and support, frequent evaluation and titration of therapies, application of advanced monitoring technologies and extensive interpretation of multiple databases.   Critical Care Time devoted to patient care services described in this note is  35  Minutes. This time reflects time of care of this signee Dr Koren BoundWesam Yacoub. This critical care time does not reflect procedure time, or teaching time or supervisory time of PA/NP/Med student/Med Resident etc but could involve care discussion time.  Alyson ReedyWesam G. Yacoub, M.D. [facility] Pulmonary/Critical Care Medicine. Pager: [phone]. After hours pager: [phone].

## 2017-08-02 NOTE — Plan of Care (Signed)
Pt NPO at this time. Q2hr turns, floating heels to protect skin. Q2hr oral care.

## 2017-08-02 NOTE — Care Management Note (Addendum)
Case Management Note  Patient Details  Name: Duane Mcmahon MRN: Duane Mcmahon Date of Birth: May 05, 1971  Subjective/Objective:   [facility] with history of chronic respiratory failure secondary to OHS. He required tracheostomy for this and [facility]. The following day he suffered a cardiac arrest after his trach had become dislodged. PEA arrest for 23 minutes. He was [facility].                    Action/Plan: NCM will follow progression.  Expected Discharge Date:                  Expected Discharge Plan:     In-House Referral:     Discharge planning Services  CM Consult  Post Acute Care Choice:    Choice offered to:     DME Arranged:    DME Agency:     HH Arranged:    HH Agency:     Status of Service:  In process, will continue to follow  If discussed at Long Length of Stay Meetings, dates discussed:    Additional Comments:  Leone Havenaylor, Kupono Marling [place], RN 07/19/2017, 9:50 AM

## 2017-08-02 NOTE — Code Documentation (Signed)
  Patient Name: Duane Mcmahon   MRN: [id number]   Date of Birth/ Sex: 1970/11/01 , male      Admission Date: 08/01/2017  Attending Provider: Carron CurieHijazi, Ali, MD  Primary Diagnosis: Acute on Chronic Hypercapnic Respiratory Failure    Indication: Pt was in his usual state of health until this AM, when he was noted to be hypoxic and unresponsive with sinus bradycardia. Code blue was subsequently called. At the time of arrival on scene, ACLS protocol was underway.   Technical Description:  - CPR performance duration:  20 minutes  - Was defibrillation or cardioversion used? Yes   - Was external pacer placed? No  - Was patient intubated pre/post CPR? Yes   Medications Administered: Y = Yes; Blank = No Amiodarone    Atropine    Calcium    Epinephrine  Y  Lidocaine    Magnesium    Norepinephrine    Phenylephrine    Sodium bicarbonate  Y  Vasopressin     Post CPR evaluation:  - Final Status - Was patient successfully resuscitated ? Yes - What is current rhythm? Sinus rhythm  - What is current hemodynamic status? Stable, BP 128/67  Miscellaneous Information:  - Labs sent, including: BMP, CBC  - Primary team notified?  Yes  - Family Notified? Yes  - Additional notes/ transfer status: Plan to transfer to ICU.      Toney RakesLacroce, Belynda Pagaduan J, MD  08/05/2017, 6:33 AM

## 2017-08-02 NOTE — Progress Notes (Signed)
Patient UOP 170 cc since admission, spoke with MD Molli KnockYacoub and reviewed labs, CVP, and levophed level in relation to UOP.  No new orders.

## 2017-08-03 ENCOUNTER — Inpatient Hospital Stay (HOSPITAL_COMMUNITY): Payer: Medicare Other

## 2017-08-03 LAB — BASIC METABOLIC PANEL
ANION GAP: 11 (ref 5–15)
Anion gap: 10 (ref 5–15)
Anion gap: 11 (ref 5–15)
Anion gap: 11 (ref 5–15)
Anion gap: 12 (ref 5–15)
Anion gap: 7 (ref 5–15)
BUN: 68 mg/dL — ABNORMAL HIGH (ref 6–20)
BUN: 56 mg/dL — AB (ref 6–20)
BUN: 57 mg/dL — AB (ref 6–20)
BUN: 59 mg/dL — AB (ref 6–20)
BUN: 58 mg/dL — AB (ref 6–20)
BUN: 57 mg/dL — AB (ref 6–20)
CALCIUM: 8.7 mg/dL — AB (ref 8.9–10.3)
CALCIUM: 8.8 mg/dL — AB (ref 8.9–10.3)
CALCIUM: 8.6 mg/dL — AB (ref 8.9–10.3)
CHLORIDE: 105 mmol/L (ref 101–111)
CHLORIDE: 109 mmol/L (ref 101–111)
CHLORIDE: 103 mmol/L (ref 101–111)
CHLORIDE: 104 mmol/L (ref 101–111)
CO2: 32 mmol/L (ref 22–32)
CO2: 32 mmol/L (ref 22–32)
CO2: 32 mmol/L (ref 22–32)
CO2: 32 mmol/L (ref 22–32)
CO2: 30 mmol/L (ref 22–32)
CO2: 32 mmol/L (ref 22–32)
CREATININE: 2.05 mg/dL — AB (ref 0.61–1.24)
CREATININE: 1.81 mg/dL — AB (ref 0.61–1.24)
CREATININE: 1.94 mg/dL — AB (ref 0.61–1.24)
Calcium: 8.6 mg/dL — ABNORMAL LOW (ref 8.9–10.3)
Calcium: 7.5 mg/dL — ABNORMAL LOW (ref 8.9–10.3)
Calcium: 8.6 mg/dL — ABNORMAL LOW (ref 8.9–10.3)
Chloride: 104 mmol/L (ref 101–111)
Chloride: 103 mmol/L (ref 101–111)
Creatinine, Ser: 1.86 mg/dL — ABNORMAL HIGH (ref 0.61–1.24)
Creatinine, Ser: 1.9 mg/dL — ABNORMAL HIGH (ref 0.61–1.24)
Creatinine, Ser: 1.76 mg/dL — ABNORMAL HIGH (ref 0.61–1.24)
GFR calc Af Amer: 49 mL/min — ABNORMAL LOW (ref >60)
GFR calc Af Amer: 48 mL/min — ABNORMAL LOW (ref >60)
GFR calc non Af Amer: 39 mL/min — ABNORMAL LOW (ref >60)
GFR calc non Af Amer: 43 mL/min — ABNORMAL LOW (ref >60)
GFR, EST AFRICAN AMERICAN: 42 mL/min — AB (ref >60)
GFR, EST AFRICAN AMERICAN: 47 mL/min — AB (ref >60)
GFR, EST AFRICAN AMERICAN: 51 mL/min — AB (ref >60)
GFR, EST AFRICAN AMERICAN: 45 mL/min — AB (ref >60)
GFR, EST NON AFRICAN AMERICAN: 41 mL/min — AB (ref >60)
GFR, EST NON AFRICAN AMERICAN: 40 mL/min — AB (ref >60)
GFR, EST NON AFRICAN AMERICAN: 44 mL/min — AB (ref >60)
GFR, EST NON AFRICAN AMERICAN: 37 mL/min — AB (ref >60)
GLUCOSE: 149 mg/dL — AB (ref 65–99)
GLUCOSE: 206 mg/dL — AB (ref 65–99)
GLUCOSE: 135 mg/dL — AB (ref 65–99)
Glucose, Bld: 141 mg/dL — ABNORMAL HIGH (ref 65–99)
Glucose, Bld: 145 mg/dL — ABNORMAL HIGH (ref 65–99)
Glucose, Bld: 173 mg/dL — ABNORMAL HIGH (ref 65–99)
Potassium: 3.5 mmol/L (ref 3.5–5.1)
Potassium: 3.7 mmol/L (ref 3.5–5.1)
Potassium: 3.7 mmol/L (ref 3.5–5.1)
Potassium: 3.7 mmol/L (ref 3.5–5.1)
Potassium: 3.9 mmol/L (ref 3.5–5.1)
Potassium: 4.4 mmol/L (ref 3.5–5.1)
SODIUM: 147 mmol/L — AB (ref 135–145)
SODIUM: 147 mmol/L — AB (ref 135–145)
SODIUM: 146 mmol/L — AB (ref 135–145)
SODIUM: 146 mmol/L — AB (ref 135–145)
Sodium: 147 mmol/L — ABNORMAL HIGH (ref 135–145)
Sodium: 147 mmol/L — ABNORMAL HIGH (ref 135–145)

## 2017-08-03 LAB — GLUCOSE, CAPILLARY
GLUCOSE-CAPILLARY: 130 mg/dL — AB (ref 65–99)
GLUCOSE-CAPILLARY: 137 mg/dL — AB (ref 65–99)
GLUCOSE-CAPILLARY: 138 mg/dL — AB (ref 65–99)
GLUCOSE-CAPILLARY: 148 mg/dL — AB (ref 65–99)
GLUCOSE-CAPILLARY: 151 mg/dL — AB (ref 65–99)
GLUCOSE-CAPILLARY: 154 mg/dL — AB (ref 65–99)
GLUCOSE-CAPILLARY: 154 mg/dL — AB (ref 65–99)
GLUCOSE-CAPILLARY: 158 mg/dL — AB (ref 65–99)
GLUCOSE-CAPILLARY: 161 mg/dL — AB (ref 65–99)
GLUCOSE-CAPILLARY: 182 mg/dL — AB (ref 65–99)
Glucose-Capillary: 174 mg/dL — ABNORMAL HIGH (ref 65–99)
Glucose-Capillary: 166 mg/dL — ABNORMAL HIGH (ref 65–99)
Glucose-Capillary: 162 mg/dL — ABNORMAL HIGH (ref 65–99)
Glucose-Capillary: 144 mg/dL — ABNORMAL HIGH (ref 65–99)
Glucose-Capillary: 133 mg/dL — ABNORMAL HIGH (ref 65–99)
Glucose-Capillary: 137 mg/dL — ABNORMAL HIGH (ref 65–99)
Glucose-Capillary: 136 mg/dL — ABNORMAL HIGH (ref 65–99)
Glucose-Capillary: 152 mg/dL — ABNORMAL HIGH (ref 65–99)
Glucose-Capillary: 159 mg/dL — ABNORMAL HIGH (ref 65–99)
Glucose-Capillary: 155 mg/dL — ABNORMAL HIGH (ref 65–99)
Glucose-Capillary: 163 mg/dL — ABNORMAL HIGH (ref 65–99)

## 2017-08-03 LAB — CBC
HEMATOCRIT: 26.5 % — AB (ref 39.0–52.0)
HEMOGLOBIN: 7.4 g/dL — AB (ref 13.0–17.0)
MCH: 26.1 pg (ref 26.0–34.0)
MCHC: 27.9 g/dL — AB (ref 30.0–36.0)
MCV: 93.3 fL (ref 78.0–100.0)
Platelets: 136 10*3/uL — ABNORMAL LOW (ref 150–400)
RBC: 2.84 MIL/uL — ABNORMAL LOW (ref 4.22–5.81)
RDW: 16.6 % — AB (ref 11.5–15.5)
WBC: 12 10*3/uL — AB (ref 4.0–10.5)

## 2017-08-03 LAB — POCT I-STAT 3, ART BLOOD GAS (G3+)
Acid-Base Excess: 12 mmol/L — ABNORMAL HIGH (ref 0.0–2.0)
Bicarbonate: 38.4 mmol/L — ABNORMAL HIGH (ref 20.0–28.0)
O2 Saturation: 97 %
PCO2 ART: 55.1 mmHg — AB (ref 32.0–48.0)
PH ART: 7.431 (ref 7.350–7.450)
PO2 ART: 73 mmHg — AB (ref 83.0–108.0)
Patient temperature: 32.6
TCO2: 40 mmol/L — ABNORMAL HIGH (ref 22–32)

## 2017-08-03 LAB — BLOOD GAS, ARTERIAL
Acid-Base Excess: 11.1 mmol/L — ABNORMAL HIGH (ref 0.0–2.0)
BICARBONATE: 35.5 mmol/L — AB (ref 20.0–28.0)
DRAWN BY: 40415
FIO2: 40
LHR: 30 {breaths}/min
MECHVT: 520 mL
O2 Saturation: 92.7 %
PATIENT TEMPERATURE: 91.6
PCO2 ART: 42.2 mmHg (ref 32.0–48.0)
PEEP: 10 cmH2O
PO2 ART: 50.7 mmHg — AB (ref 83.0–108.0)
pH, Arterial: 7.516 — ABNORMAL HIGH (ref 7.350–7.450)

## 2017-08-03 LAB — PROCALCITONIN: Procalcitonin: 4.49 ng/mL

## 2017-08-03 LAB — TROPONIN I: TROPONIN I: 0.09 ng/mL — AB (ref <0.03)

## 2017-08-03 LAB — MAGNESIUM: MAGNESIUM: 2.8 mg/dL — AB (ref 1.7–2.4)

## 2017-08-03 LAB — PHOSPHORUS: PHOSPHORUS: 1.6 mg/dL — AB (ref 2.5–4.6)

## 2017-08-03 MED ORDER — INSULIN ASPART 100 UNIT/ML ~~LOC~~ SOLN
2.0000 [IU] | SUBCUTANEOUS | Status: DC
  Administered 2017-08-03 – 2017-08-04 (×7): 4 [IU] via SUBCUTANEOUS
  Administered 2017-08-05: 2 [IU] via SUBCUTANEOUS
  Administered 2017-08-05 (×4): 4 [IU] via SUBCUTANEOUS
  Administered 2017-08-05: 6 [IU] via SUBCUTANEOUS
  Administered 2017-08-06 (×4): 4 [IU] via SUBCUTANEOUS

## 2017-08-03 MED ORDER — INSULIN GLARGINE 100 UNIT/ML ~~LOC~~ SOLN
30.0000 [IU] | SUBCUTANEOUS | Status: DC
  Administered 2017-08-03 – 2017-08-05 (×3): 30 [IU] via SUBCUTANEOUS
  Filled 2017-08-03 (×4): qty 0.3

## 2017-08-03 MED ORDER — NOREPINEPHRINE BITARTRATE 1 MG/ML IV SOLN
0.0000 ug/min | INTRAVENOUS | Status: DC
  Administered 2017-08-03: 14 ug/min via INTRAVENOUS
  Administered 2017-08-04: 17 ug/min via INTRAVENOUS
  Administered 2017-08-04: 30 ug/min via INTRAVENOUS
  Administered 2017-08-05: 26 ug/min via INTRAVENOUS
  Administered 2017-08-05: 20 ug/min via INTRAVENOUS
  Filled 2017-08-03 (×6): qty 16

## 2017-08-03 NOTE — Progress Notes (Signed)
Critical ABG values given to Edison PaceS. Jackson, RN.  Henreitta LeberP. Hoffman, NP paged.

## 2017-08-03 NOTE — Progress Notes (Signed)
Initial Nutrition Assessment  DOCUMENTATION CODES:   Morbid obesity  INTERVENTION:   If remains intubated recommend:   Vital High Protein @ 35 ml/hr (840 ml.day) 60 ml Prostat QID  Provides: 1640 kcal, 193 grams protein, and 702 ml free water.   NUTRITION DIAGNOSIS:   Increased nutrient needs related to wound healing as evidenced by estimated needs.  GOAL:   Provide needs based on ASPEN/SCCM guidelines  MONITOR:   Skin, Vent status, I & O's  REASON FOR ASSESSMENT:   Ventilator    ASSESSMENT:   Pt with PMH of super morbid obesity, OSA/OHS, COPD, IDDM, HTN, afib with RVR [facility] after trach for rehab. 1/23 pt suffered cardiac arrest after trach became dislodged, [facility].    PEA arrest on hypothermia protocol Patient is currently on ventilator support via trach Propofol: none  Labs and meds reviewed Levophed @ 18.5 - trending down Nimbex for paralytic -- MAP 70-80  NG tube with tip in distal stomach  CBG's: [phone] [id number]  NUTRITION - FOCUSED PHYSICAL EXAM:    Most Recent Value  Orbital Region  No depletion  Upper Arm Region  No depletion  Thoracic and Lumbar Region  No depletion  Buccal Region  No depletion  Temple Region  No depletion  Clavicle Bone Region  No depletion  Clavicle and Acromion Bone Region  No depletion  Scapular Bone Region  Unable to assess  Dorsal Hand  No depletion  Patellar Region  No depletion  Anterior Thigh Region  No depletion  Posterior Calf Region  No depletion  Edema (RD Assessment)  Severe  Hair  Reviewed  Eyes  Unable to assess  Mouth  Unable to assess  Skin  Reviewed  Nails  Reviewed      Diet Order:  No diet orders on file  EDUCATION NEEDS:   No education needs have been identified at this time  Skin:  Skin Assessment: Skin Integrity Issues: Skin Integrity Issues:: Stage II Stage II: neck  Last BM:  unknown  Height:   Ht Readings from Last 1 Encounters:  08/03/17 5'  8" (1.727 m)    Weight:   Wt Readings from Last 1 Encounters:  08/03/17 (!) 479 lb (217.3 kg)    Ideal Body Weight:  70 kg  BMI:  Body mass index is 72.83 kg/m.  Estimated Nutritional Needs:   Kcal:  [id number]  Protein:  >175 grams  Fluid:  > 1.5 L/day  Kendell BaneHeather Yalitza Teed RD, LDN, CNSC [phone] Pager [phone] After Hours Pager

## 2017-08-03 NOTE — Progress Notes (Signed)
yacPULMONARY / CRITICAL CARE MEDICINE   Name: Duane Mcmahon MRN: [id number] DOB: [birth year]    ADMISSION DATE:  07/23/2017 CONSULTATION DATE:  Direct [facility]  REFERRING MD:  [facility]  CHIEF COMPLAINT:  Cardiac Arrest   HISTORY OF PRESENT ILLNESS:   HPI obtained from medical chart review as patient is acutely encephalopathic and unable to provide history.    47 [facility] for chronic respiratory failure with hypoxia and hypercarbia, tracheostomy placement, and ventilator dependency.    Has a past medical history of morbid obesity, OSA/OHS, COPD, IDDM, HTN, Afib with RVR, and was a difficult airway at OSH.  During the morning of 1/22, patient had increasing agitation and was given Ativan.  Shortly afterwards he became difficult to ventilate and noted that his 8 XLT trach became dislodged, and patient subsequently developed bradycardia changing to PEA/ asystole.  CPR provided between 0548 - 0611 with ACLS measures with conversion to sinus tachycardia.  His tracheotomy was unable to be placed back.  A 6.5 ETT was placed in his stoma.  After ROSC, patient remained unresponsive.  PCCM called for direct admit to ICU following cardiac arrest.    Prior to cardiac arrest, patient was on fentanyl drip at 100 mcg/hr and cardizem drip at 10 mg/hr.  His baseline mental status was alert and able to follow commands and move all extremities.  His ventilator settings were AC 500/ 20/ 40%/5.    SUBJECTIVE: Hyperthermia protocol underway 33C. On 14 levo to keep MAP > 80 mmHg. Remains sedated with fentanyl, versed, and low dose nimbex.  CT negative. EEG with diffuse slowing.   VITAL SIGNS: BP (!) 117/52   Pulse (!) 55   Temp (!) 91.4 F (33 C) (Bladder)   Resp (!) 30   Ht 5\' 8"  (1.727 m)   Wt (!) 217.3 kg (479 lb)   SpO2 95%   BMI 72.83 kg/m   HEMODYNAMICS: CVP:  [17 mmHg-25 mmHg] 21  mmHg  VENTILATOR SETTINGS: Vent Mode: PRVC FiO2 (%):  [40 %-100 %] 50 % Set Rate:  [30 bmp] 30 bmp Vt Set:  [520 mL] 520 mL PEEP:  [10 cmH20] 10 cmH20 Plateau Pressure:  [32 cmH20-40 cmH20] 36 cmH20  INTAKE / OUTPUT: I/O last 3 completed shifts: In: 3246.5 [I.V.:2186.5; NG/GT:10; IV Piggyback:1050] Out: 380 [Urine:380]  PHYSICAL EXAMINATION: General: 47 year old morbidly obese male Neuro: sedated, unresponsive.  HEENT:[place]/AT, Tracheostomy in place Cardiovascular: RRR, distant, no MRG Lungs: Distant, coarse. Synchronous with vent.  Abdomen: hypoactive. Non-distended.  Musculoskeletal: hyperpigmented patchy areas to bilateral lower extremities.  Skin: Dry.  Cool  LABS:  BMET Recent Labs  Lab 07/16/2017 2349 08/03/17 0341 08/03/17 0750  NA 147* 147* 147*  K 3.9 3.7 3.7  CL 104 103 104  CO2 32 32 32  BUN 58* 68* 57*  CREATININE 1.94* 1.81* 1.86*  GLUCOSE 206* 149* 135*    Electrolytes Recent Labs  Lab 07/13/2017 0615  07/17/2017 2349 08/03/17 0341 08/03/17 0750  CALCIUM 9.2   < > 8.7* 8.8* 8.6*  MG 3.5*  --   --  2.8*  --   PHOS  --   --   --  1.6*  --    < > = values in this interval not displayed.    CBC Recent Labs  Lab 07/18/2017 0615 07/15/2017 0855  07/15/2017 1358 07/13/2017 1611 08/03/17 0341  WBC 20.3* 19.1*  DUPL SEE [id number]  --   --   --  12.0*  HGB 8.2* 7.8*  DUPL SEE [id number]   < > 8.2* 8.5* 7.4*  HCT 31.8* 28.9*  DUPL SEE [id number]   < > 24.0* 25.0* 26.5*  PLT PLATELET CLUMPS NOTED ON SMEAR, UNABLE TO ESTIMATE 116*  DUPL SEE [id number]  --   --   --  136*   < > = values in this interval not displayed.    Coag's Recent Labs  Lab 07/20/2017 0615 07/22/2017 0855 07/27/2017 1652  APTT  --  28 30  INR 1.43 1.46 1.60    Sepsis Markers Recent Labs  Lab 08/07/2017 0830  LATICACIDVEN 3.1*    ABG Recent Labs  Lab 07/13/2017 0310 07/29/2017 1036 08/03/17 0445  PHART 7.364 7.255* 7.516*  PCO2ART 69.1* 86.8* 42.2  PO2ART 68.4* 158.0* 50.7*    Liver  Enzymes Recent Labs  Lab 07/21/2017 0615  AST 47*  ALT 26  ALKPHOS 96  BILITOT 1.5*  ALBUMIN 2.9*    Cardiac Enzymes Recent Labs  Lab 07/18/2017 0855 08/09/2017 1953 08/03/17 0152  TROPONINI 0.06* 0.10* 0.09*    Glucose Recent Labs  Lab 08/03/17 0353 08/03/17 0459 08/03/17 0556 08/03/17 0707 08/03/17 0801 08/03/17 0904  GLUCAP 162* 158* 144* 138* 133* 130*    Imaging Ct Head Wo Contrast  Result Date: 08/07/2017 CLINICAL DATA:  Cardiac arrest after removal of tracheostomy tube. EXAM: CT HEAD WITHOUT CONTRAST TECHNIQUE: Contiguous axial images were obtained from the base of the skull through the vertex without intravenous contrast. COMPARISON:  None. FINDINGS: Large body habitus results in overall noisy image quality. Mild motion degraded examination. BRAIN: No intraparenchymal hemorrhage, mass effect nor midline shift. The ventricles and sulci are normal. No acute large vascular territory infarcts. No abnormal extra-axial fluid collections. Basal cisterns are patent. VASCULAR: Mild calcific atherosclerosis. SKULL/SOFT TISSUES: No skull fracture. Multifocal LEFT hyperostosis frontalis internus. No significant soft tissue swelling. ORBITS/SINUSES: The included ocular globes and orbital contents are normal.Severe pan paranasal sinusitis. Bilateral mastoid effusions without air cell coalescence. OTHER: Small volume bilateral lower face subcutaneous gas. IMPRESSION: 1. No acute intracranial process on this motion and habitus limited examination. 2. Lower face subcutaneous emphysema. Electronically Signed   By: Awilda Metro M.D.   On: 08/01/2017 16:00   Dg Chest Port 1 View  Result Date: 08/03/2017 CLINICAL DATA:  Tracheostomy placement EXAM: PORTABLE CHEST 1 VIEW COMPARISON:  Portable chest x-ray of 07/29/2017 FINDINGS: Tracheostomy is unchanged in position with the tip approximately 7.5 cm above the carina. There is little change in diffuse airspace disease and cardiomegaly.  Mediastinal contours are unchanged with some superior mediastinal widening possibly due to semi recumbent position of this portable chest x-ray. Left central venous line tip overlies the lateral wall of the upper SVC. IMPRESSION: 1. Tracheostomy tip approximately 7.5 cm above the carina. 2. Little change in diffuse airspace disease and cardiomegaly. Electronically Signed   By: Dwyane Dee M.D.   On: 08/03/2017 08:07   Dg Chest Port 1 View  Result Date: 07/26/2017 CLINICAL DATA:  Central line infiltration initial encounter EXAM: PORTABLE CHEST 1 VIEW COMPARISON:  07/31/2017 FINDINGS: Endotracheal tube has been withdrawn and is now 8 cm above the carina. Recommend advancing 4 cm. Feeding tube enters the stomach with the tip not visualized. Left jugular central venous catheter tip in the SVC. No pneumothorax. Severe airspace disease on the left with partial re-expansion of the left lung compared with earlier today. No change in diffuse airspace disease in the right lung. No significant effusion. IMPRESSION: Endotracheal  tube 8 cm above the carina.  Recommend advancing 4 cm. Left subclavian central venous catheter tip in the SVC without pneumothorax. Severe diffuse bilateral airspace disease which may be due to edema or pneumonia. Improved aeration of the left lung following withdrawal of the endotracheal tube. Electronically Signed   By: Marlan Palau M.D.   On: 08-09-17 09:51     STUDIES:  CT head 1/23 > No acute intracranial process on this motion and habitus limited examination. Lower face subcutaneous emphysema. EEG 1/23 > markedly abnormal due to diffuse background suppression and lack of EEG reactivity with noxious stimulation. (of note, patient deeply sedated with low dose neuromuscular blockade at the time of the this study)  CULTURES:   ANTIBIOTICS:   SIGNIFICANT EVENTS: 1/22 admit to [facility] from [place] with tracheostomy for OHS with chronic resp failure 1/23 cardiac arrest 23 mins after  trach coming dislodged.   LINES/TUBES: 8 XLT preadmit > 1/18 (from outside facility)>>>1/23>>>1/23 (JY)>>> ETT (via trach stoma) 1/23 >>>1/23 [facility] CVL 1/23 > Art line 1/23 >   DISCUSSION: 47 year old super morbidly obese male with history of chronic respiratory failure secondary to OHS. He required tracheostomy for this and was transferred to [facility] for rehabilitation. The following day he suffered a cardiac arrest after his trach had become dislodged. PEA arrest for 23 minutes. He was transferred to ICU at [facility] where he had tracheostomy replaced and was treated with hypothermia protocol 33C.   ASSESSMENT / PLAN:  PULMONARY A: Acute on chronic hypoxic/hypercarbic respiratory failure ARDS COPD without acute exacerbation  P:   Full vent support Transition to ARDS protocol (plataeu pressures 30 on current settings) VAP bundle ABG serial Not a candidate for weaning at this time.  Scheduled BDs  CARDIOVASCULAR A:  PEA arrest: likely primary respiratory arrest. Troponin peaked at 0.1 Atrial fibrillation with RVR - remains sinus here.  CHF reported from recent admission.  H/o HTN  P:  Telemetry monitoring TTM 33 C: will start rewarming process at 1230 MAP goal > Holding diltiazem infusion Echocardiogram  RENAL A:   AKI -seems volume overloaded  P:   Unable to diurese due to pressor demands I&O Follow BMP  GASTROINTESTINAL A:   Nutrition  P:   Hold TF for now IV protonix  HEMATOLOGIC A:   No known issues  P:  Follow CBC SQ heparin for VTE ppx  INFECTIOUS A:   Bilateral infiltrates, likely edema vs ARDS  P:   Follow WBC and fever curve Will check PCT DC vanc. Continue meropenem. If PC neg tomorrow can DC.    ENDOCRINE A:   Insulin dependent diabetes mellitus   P:   Insulin infusion.   NEUROLOGIC A:   Acute anoxic encephalopathy  P:   RASS goal: -4 to -5 while cool Versed and fentanyl infusions for  sedation Low dose nimbex for shivering.  Will re-assess once re-warmed and can lower sedation.  FAMILY  - Updates: Family supposedly coming in later today. Updated yesterday by Dr. Molli Knock  - Inter-disciplinary family meet or Palliative Care meeting due by:  1/30  Joneen Roach, AGACNP-BC [place] Pulmonology/Critical Care Pager [phone] or [phone]  08/03/2017 9:28 AM  Attending Note:  47 year old male s/p cardiac arrest after loosing his tracheostomy in [facility].  Patient is undergoing hypothermia protocol, starting to warm now.  On exam, sedated and paralyzed with distant BS.  I reviewed CXR myself, trach is in good position.  Discussed with PCCM-NP.  Will adjust vent via ARDS protocol.  Continue abx.  Continue aggressive care but LCB with no CPR/cardioversion per family discussion.  Once warmed then will begin assessing brain function.  The patient is critically ill with multiple organ systems failure and requires high complexity decision making for assessment and support, frequent evaluation and titration of therapies, application of advanced monitoring technologies and extensive interpretation of multiple databases.   Critical Care Time devoted to patient care services described in this note is  35  Minutes. This time reflects time of care of this signee Dr Koren Bound. This critical care time does not reflect procedure time, or teaching time or supervisory time of PA/NP/Med student/Med Resident etc but could involve care discussion time.  Alyson Reedy, M.D. [facility] Pulmonary/Critical Care Medicine. Pager: [phone]. After hours pager: [phone].

## 2017-08-03 NOTE — Progress Notes (Signed)
Meredith Pelalled ELink MD to clarify MAP goal when rewarming. Ordered to decrease goal from 80 to 65 once completely rewarmed to 37. Will continue to monitor patient closely.

## 2017-08-03 NOTE — Progress Notes (Signed)
Inpatient Diabetes Program Recommendations  AACE/ADA: New Consensus Statement on Inpatient Glycemic Control (2015)  Target Ranges:  Prepandial:   less than 140 mg/dL      Peak postprandial:   less than 180 mg/dL (1-2 hours)      Critically ill patients:  140 - 180 mg/dL   Lab Results  Component Value Date   GLUCAP 137 (H) 08/03/2017   HGBA1C 6.1 (H) 04/22/18    Review of Glycemic Control Results for Duane Mcmahon, Duane Mcmahon (MRN [id number]) as of 08/03/2017 12:41  Ref. Range 08/03/2017 09:04 08/03/2017 10:00 08/03/2017 11:06 08/03/2017 11:40 08/03/2017 11:51 08/03/2017 12:11  Glucose-Capillary Latest Ref Range: 65 - 99 mg/dL 469130 (H) 629137 (H) 528136 (H)   137 (H)   Diabetes history: Type 2DM Outpatient Diabetes medications: Unknown Current orders for Inpatient glycemic control: insulin drip  Inpatient Diabetes Program Recommendations:    Patient on cooling protocol and awaiting family.   When ready to transition off the insulin drip (per PCCM), would recommend starting Lantus 40 Units QD two hours prior to discontinuation of insulin drip and continue to follow ICU glycemic order set phase III.  Thanks, Lujean RaveLauren Emogene Muratalla, MSN, RNC-OB Diabetes Coordinator [phone] (8a-5p)

## 2017-08-03 NOTE — Progress Notes (Signed)
RN transitioning patient off insulin drip per ICU glycemic protocol.  Per last insulin drip rate, Lantus 30 units to be ordered but per notes the Diabetes coordinator recommends 40 units of Lantus.  Spoke with MD Molli KnockYacoub, MD advised to order Lantus at 30 units and MD will review orders for adjustments when/if tube feeds are started.

## 2017-08-03 NOTE — Progress Notes (Signed)
ELINK to complete CDS referral per protocol.

## 2017-08-03 NOTE — Progress Notes (Signed)
Vent changes made per P. Mikey BussingHoffman, NP order.

## 2017-08-04 ENCOUNTER — Inpatient Hospital Stay (HOSPITAL_COMMUNITY): Payer: Medicare Other

## 2017-08-04 LAB — BASIC METABOLIC PANEL
ANION GAP: 8 (ref 5–15)
Anion gap: 10 (ref 5–15)
Anion gap: 7 (ref 5–15)
BUN: 59 mg/dL — ABNORMAL HIGH (ref 6–20)
BUN: 64 mg/dL — AB (ref 6–20)
BUN: 62 mg/dL — ABNORMAL HIGH (ref 6–20)
CALCIUM: 8.5 mg/dL — AB (ref 8.9–10.3)
CHLORIDE: 105 mmol/L (ref 101–111)
CHLORIDE: 104 mmol/L (ref 101–111)
CO2: 33 mmol/L — ABNORMAL HIGH (ref 22–32)
CO2: 34 mmol/L — AB (ref 22–32)
CO2: 34 mmol/L — ABNORMAL HIGH (ref 22–32)
Calcium: 8.5 mg/dL — ABNORMAL LOW (ref 8.9–10.3)
Calcium: 8.6 mg/dL — ABNORMAL LOW (ref 8.9–10.3)
Chloride: 104 mmol/L (ref 101–111)
Creatinine, Ser: 2.11 mg/dL — ABNORMAL HIGH (ref 0.61–1.24)
Creatinine, Ser: 2.24 mg/dL — ABNORMAL HIGH (ref 0.61–1.24)
Creatinine, Ser: 2.53 mg/dL — ABNORMAL HIGH (ref 0.61–1.24)
GFR calc non Af Amer: 28 mL/min — ABNORMAL LOW (ref >60)
GFR calc non Af Amer: 35 mL/min — ABNORMAL LOW (ref >60)
GFR, EST AFRICAN AMERICAN: 33 mL/min — AB (ref >60)
GFR, EST AFRICAN AMERICAN: 41 mL/min — AB (ref >60)
GFR, EST AFRICAN AMERICAN: 38 mL/min — AB (ref >60)
GFR, EST NON AFRICAN AMERICAN: 33 mL/min — AB (ref >60)
Glucose, Bld: 191 mg/dL — ABNORMAL HIGH (ref 65–99)
Glucose, Bld: 194 mg/dL — ABNORMAL HIGH (ref 65–99)
Glucose, Bld: 204 mg/dL — ABNORMAL HIGH (ref 65–99)
POTASSIUM: 5.9 mmol/L — AB (ref 3.5–5.1)
POTASSIUM: 4.9 mmol/L (ref 3.5–5.1)
POTASSIUM: 5.6 mmol/L — AB (ref 3.5–5.1)
SODIUM: 146 mmol/L — AB (ref 135–145)
SODIUM: 146 mmol/L — AB (ref 135–145)
SODIUM: 147 mmol/L — AB (ref 135–145)

## 2017-08-04 LAB — GLUCOSE, CAPILLARY
GLUCOSE-CAPILLARY: 165 mg/dL — AB (ref 65–99)
GLUCOSE-CAPILLARY: 177 mg/dL — AB (ref 65–99)
Glucose-Capillary: 189 mg/dL — ABNORMAL HIGH (ref 65–99)
Glucose-Capillary: 173 mg/dL — ABNORMAL HIGH (ref 65–99)
Glucose-Capillary: 172 mg/dL — ABNORMAL HIGH (ref 65–99)

## 2017-08-04 LAB — URINE CULTURE

## 2017-08-04 LAB — PROCALCITONIN: PROCALCITONIN: 4.19 ng/mL

## 2017-08-04 LAB — VANCOMYCIN, RANDOM: Vancomycin Rm: 19

## 2017-08-04 MED ORDER — VANCOMYCIN HCL 10 G IV SOLR
1750.0000 mg | INTRAVENOUS | Status: DC
  Administered 2017-08-04: 1750 mg via INTRAVENOUS
  Filled 2017-08-04 (×2): qty 1750

## 2017-08-04 MED ORDER — FREE WATER
200.0000 mL | Status: DC
  Administered 2017-08-04 – 2017-08-06 (×14): 200 mL

## 2017-08-04 NOTE — Progress Notes (Signed)
Goals of care discussion  Patient now status post hypothermia protocol.  Has cough, some reflexes, but overall minimal neurological recovery.  Neurology consulted, appreciate their help.  Discussed current findings with the patient's wife, brother, and sister.  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  I also discussed with them our concern about his rising creatinine and worsening renal function.  He had shared with his brother just days before that he would not want to live his life on a ventilator or debilitated and that if that time were to come he would rather discontinue life support than be maintained on invasive therapies.  Family verbalizes the above concern to me during our conversation.  They understand the likelihood of meaningful recovery to be almost nonexistent.  They do not wish to prolong suffering however they do request a little more time for a "miracle".  His wife states if things get worse she would then want to "let him go" but just feels she needs a little more time to come to terms.  At this point the family has agreed to continue the current therapies, however we will not escalate care.  Specifically we will not escalate pressors, we will not offer him dialysis, and we will not perform any level of acute cardiac life support including arrhythmia therapy should he decompensate.  Anticipate we will be transitioning to withdrawal of care in the next 24-48 hours.   Simonne MartinetPeter E Maha Fischel ACNP-BC [facility] Pulmonary/Critical Care Pager # [phone] OR # [phone] if no answer

## 2017-08-04 NOTE — Consult Note (Signed)
NEURO HOSPITALIST CONSULT NOTE   Requestig physician: Dr. Nelda Marseille   Reason for Consult: Prognostication   History obtained from: Chart   HPI:                                                                                                                                          Duane Mcmahon is an 47 y.o. male with a past medical history of morbid obesity, obstructive sleep apnea, COPD, diabetes, hypertension, A. fib with RVR [facility] chronic respiratory failure with hypoxia and hypercapnia, tracheostomy was placed, and ventilator dependent.  During the morning of 1/22 the patient was found to have increased agitation given Ativan.  Shortly afterwards became difficult eventually was noted that his trach became dislodged.  Subsequently he developed bradycardia changing into PEA and asystole.  CPR was provided between 0548- 0611 (approximately 20 minutes of downtime slightly more.)  CT was obtained however due to his habitus it was limited.  This was obtained 2 days ago and no intracranial abnormalities were noted.  Rewarmed 1/25 at 0115 (2 days ago)  Versed was stopped at 140 morning today Fentanyl was cc at 2:00 in the morning this morning Neurology was called for prognostication.  PMH As above  Unable to obtain family hisrtoy No family history on file.  Social History:  has no tobacco, alcohol, and drug history on file.  Allergies  Allergen Reactions  . Gabapentin   . Penicillins     MEDICATIONS:                                                                                                                     Prior to Admission:  No medications prior to admission.   Scheduled: . budesonide (PULMICORT) nebulizer solution  0.5 mg Nebulization BID  . chlorhexidine gluconate (MEDLINE KIT)  15 mL Mouth Rinse BID  . Chlorhexidine Gluconate Cloth  6 each Topical Daily  . free water  200 mL  Per Tube Q4H  . heparin  5,000 Units Subcutaneous Q8H  . insulin aspart  2-6 Units Subcutaneous Q4H  . insulin glargine  30 Units Subcutaneous Q24H  . ipratropium-albuterol  3 mL Nebulization Q6H  . mouth rinse  15 mL Mouth Rinse  10 times per day  . pantoprazole (PROTONIX) IV  40 mg Intravenous QHS  . sodium chloride flush  10-40 mL Intracatheter Q12H   Continuous: . sodium chloride 10 mL/hr at 08/03/17 2000  . meropenem (MERREM) IV Stopped (08/04/17 8250)  . midazolam (VERSED) infusion Stopped (08/04/17 0202)  . norepinephrine (LEVOPHED) Adult infusion 14 mcg/min (08/04/17 1224)     ROS:                                                                                                                                       History obtained from unobtainable from patient due to mental status  Blood pressure (!) 113/35, pulse 99, temperature 98.6 F (37 C), temperature source Axillary, resp. rate 13, height 5' 8"  (1.727 m), weight (!) 220.4 kg (486 lb), SpO2 97 %.  General Examination:                                                                                                       Physical Exam  HEENT-  Normocephalic, no lesions, without obvious abnormality.  Normal external eye and conjunctiva.   Cardiovascular- S1-S2 audible, pulses palpable throughout   Lungs-no rhonchi or wheezing noted, no excessive working breathing.  Saturations within normal limits Abdomen- All 4 quadrants palpated and nontender Extremities- Warm, dry and intact Musculoskeletal-no joint tenderness, deformity or swelling Skin-warm and dry, no hyperpigmentation, vitiligo, or suspicious lesions  Neurological Examination Mental Status: Patient does not respond to verbal stimuli.  Does not respond to deep sternal rub.  Does not follow commands.  No verbalizations noted.  Cranial Nerves: II: patient does not respond confrontation bilaterally, pupils right 2 mm, left 2 mm,and reactive bilaterally III,IV,VI:  doll's response absent bilaterally.  V,VII: corneal reflex present bilaterally  VIII: patient does not respond to verbal stimuli IX,X: gag reflex reduced, XI: trapezius strength unable to test bilaterally XII: tongue strength unable to test Motor: Extremities flaccid throughout.  No spontaneous movement noted.  No purposeful movements noted. Sensory: Does not respond to noxious stimuli in any extremity. Deep Tendon Reflexes:  Absent throughout. Plantars: absent bilaterally Cerebellar: Unable to perform   Lab Results: Basic Metabolic Panel: Recent Labs  Lab 07/11/2017 0615  08/03/17 0341  08/03/17 1555 08/03/17 1937 08/03/17 2337 08/04/17 0316 08/04/17 1148  NA 147*   < > 147*   < > 146* 146* 146* 147* 146*  K 5.6*   < > 3.7   < > 3.5 4.4 4.9 5.6* 5.9*  CL 101   < > 103   < > 109 103 104 104 105  CO2 30   < > 32   < > 30 32 34* 33* 34*  GLUCOSE 299*   < > 149*   < > 145* 173* 194* 204* 191*  BUN 50*   < > 68*   < > 56* 59* 64* 59* 62*  CREATININE 1.39*   < > 1.81*   < > 1.76* 2.05* 2.11* 2.24* 2.53*  CALCIUM 9.2   < > 8.8*   < > 7.5* 8.6* 8.6* 8.5* 8.5*  MG 3.5*  --  2.8*  --   --   --   --   --   --   PHOS  --   --  1.6*  --   --   --   --   --   --    < > = values in this interval not displayed.    Liver Function Tests: Recent Labs  Lab 07/20/2017 0615  AST 47*  ALT 26  ALKPHOS 96  BILITOT 1.5*  PROT 8.7*  ALBUMIN 2.9*   CBC: Recent Labs  Lab 07/22/2017 0615 07/28/2017 0855 08/03/2017 1059 07/16/2017 1202 07/19/2017 1358 07/20/2017 1611 08/03/17 0341  WBC 20.3* 19.1*  DUPL SEE [id number]  --   --   --   --  12.0*  NEUTROABS 15.0* 16.1*  PENDING  --   --   --   --   --   HGB 8.2* 7.8*  DUPL SEE [id number] 8.8* 8.5* 8.2* 8.5* 7.4*  HCT 31.8* 28.9*  DUPL SEE [id number] 26.0* 25.0* 24.0* 25.0* 26.5*  MCV 99.7 97.0  DUPL SEE [id number]  --   --   --   --  93.3  PLT PLATELET CLUMPS NOTED ON SMEAR, UNABLE TO ESTIMATE 116*  DUPL SEE [id number]  --   --   --   --  136*   Cardiac  Enzymes: Recent Labs  Lab 07/31/2017 0855 07/13/2017 1953 08/03/17 0152  CKTOTAL 67  --   --   CKMB 3.7  --   --   TROPONINI 0.06* 0.10* 0.09*    CBG: Recent Labs  Lab 08/03/17 1956 08/03/17 2339 08/04/17 0323 08/04/17 0821 08/04/17 1245  GLUCAP 154* 182* 189* 173* 165*     Imaging: Ct Head Wo Contrast  Result Date: 07/31/2017 CLINICAL DATA:  Cardiac arrest after removal of tracheostomy tube. EXAM: CT HEAD WITHOUT CONTRAST TECHNIQUE: Contiguous axial images were obtained from the base of the skull through the vertex without intravenous contrast. COMPARISON:  None. FINDINGS: Large body habitus results in overall noisy image quality. Mild motion degraded examination. BRAIN: No intraparenchymal hemorrhage, mass effect nor midline shift. The ventricles and sulci are normal. No acute large vascular territory infarcts. No abnormal extra-axial fluid collections. Basal cisterns are patent. VASCULAR: Mild calcific atherosclerosis. SKULL/SOFT TISSUES: No skull fracture. Multifocal LEFT hyperostosis frontalis internus. No significant soft tissue swelling. ORBITS/SINUSES: The included ocular globes and orbital contents are normal.Severe pan paranasal sinusitis. Bilateral mastoid effusions without air cell coalescence. OTHER: Small volume bilateral lower face subcutaneous gas. IMPRESSION: 1. No acute intracranial process on this motion and habitus limited examination. 2. Lower face subcutaneous emphysema. Electronically Signed   By: Elon Alas M.D.   On: 07/28/2017 16:00   Dg Chest Port 1 View  Result Date: 08/03/2017 CLINICAL DATA:  Tracheostomy placement EXAM: PORTABLE CHEST 1 VIEW COMPARISON:  Portable chest x-ray of 07/26/2017 FINDINGS: Tracheostomy is unchanged in  position with the tip approximately 7.5 cm above the carina. There is little change in diffuse airspace disease and cardiomegaly. Mediastinal contours are unchanged with some superior mediastinal widening possibly due to semi  recumbent position of this portable chest x-ray. Left central venous line tip overlies the lateral wall of the upper SVC. IMPRESSION: 1. Tracheostomy tip approximately 7.5 cm above the carina. 2. Little change in diffuse airspace disease and cardiomegaly. Electronically Signed   By: Ivar Drape M.D.   On: 08/03/2017 08:07   Assessment and plan per attending neurologist  Etta Quill PA-C [facility] [phone]  08/04/2017, 1:14 PM Attending Neurohospitalist Addendum Patient seen and examined with APP/Resident. Agree with the history and physical as documented above. EEG 1/23 - markedly abnormal due to diffuse background suppression and lack of EEG reactivity with noxious stimulation. I have reviewed images independently. CTH: severely limited by motion and body habitus.  No acute intracranial process.  Assessment/Plan: 47 year old with a past history of morbid obesity obstructive sleep apnea COPD diabetes hypertension A. fib with R[facility] with hypoxia, hypercapnia tracheostomy was placed in his vent dependent.  During the morning of January 22 patient was found to be increasingly agitated and got Ativan.  Shortly afterwards his trach became dislodged and he developed bradycardia and went into PEA and asystole.  Approximately 20 minutes of downtime before return of spontaneous circulation. He was treated with hypothermia protocol at [facility] and was rewarmed and reached normothermia at 0200 hrs. Sedation off at 0215 hrs. His neurological exam remains poor off of sedation while he is now normothermic. It is my understanding based on the review of charts, that at baseline this patient is very debilitated and completely ventilator dependent along with multiple comorbidities. His exam shows preserved brain stem reflexes with not much indication towards higher cortical function, with the caveat that he just got  normothermic.  Impression Hypoxic/anoxic brain injury   Recommendations It will be ideal to get an MRI to get an idea of the extent of anoxic injury. Due to his body habitus, unfortunately would not be able to get an MRI. I would recommend obtaining a repeat CT scan of the head that might show [place] the extent of anoxic injury to some extent, although not as well as MRI might have. I agree with obtaining a repeat EEG. Since the patient was on hypothermia protocol and was very recently rewarmed to normothermia, it is too soon to officially prognosticate, but given his history, comorbidities and current exam, it is extremely likely that his prognosis in terms of neurologically meaningful recovery would be poor.  -- Amie Portland, MD [facility] Pager: [phone]  If 7pm to 7am, please call on call as listed on AMION.

## 2017-08-04 NOTE — Procedures (Signed)
  ELECTROENCEPHALOGRAM REPORT  Date of Study: 08/04/17  Patient's Name: Duane Mcmahon MRN: [id number] Date of Birth: 1971-05-10  Referring Provider: Salvadore Dom, NP  Clinical History: [name] is a 47 y.o. M who developed PEA and asystole on 1/22.  Approximately 20 minutes of downtime before return of spontaneous circulation. Treated with hypothermia protocol, then recently rewarmed. Neurological exam shows preserved brain stem reflexes without appreciable higher cortical function. EEG (07/30/2017) with diffuse background suppression and lack of reactivity.   Head CT (08/04/17) without intracranial abnormality.   Medications: Scheduled Meds: . budesonide (PULMICORT) nebulizer solution  0.5 mg Nebulization BID  . chlorhexidine gluconate (MEDLINE KIT)  15 mL Mouth Rinse BID  . Chlorhexidine Gluconate Cloth  6 each Topical Daily  . free water  200 mL Per Tube Q4H  . heparin  5,000 Units Subcutaneous Q8H  . insulin aspart  2-6 Units Subcutaneous Q4H  . insulin glargine  30 Units Subcutaneous Q24H  . ipratropium-albuterol  3 mL Nebulization Q6H  . mouth rinse  15 mL Mouth Rinse 10 times per day  . pantoprazole (PROTONIX) IV  40 mg Intravenous QHS  . sodium chloride flush  10-40 mL Intracatheter Q12H   Continuous Infusions: . sodium chloride 10 mL/hr at 08/03/17 2000  . meropenem (MERREM) IV Stopped (08/04/17 1417)  . midazolam (VERSED) infusion Stopped (08/04/17 0202)  . norepinephrine (LEVOPHED) Adult infusion 28 mcg/min (08/04/17 1851)  . vancomycin 1,750 mg (08/04/17 1851)   PRN Meds:.midazolam, sodium chloride flush            Technical Summary: This is a standard 16 channel EEG recording performed according to the international 10-20 electrode system.  AP bipolar, transverse bipolar, and referential montages were obtained, and digitally reformatted as necessary.  Duration of tracing: 23:48  Description: Pt is noted to be comatose and off sedation. Diffuse EKG  artifact is noted and diffuse suppression of background amplitude is again observed.  At 3 uV sensitivity, there are periods of low amplitude, 4-5 Hz generalized rhythmic theta lasting up to 10 seconds, with intervening periods of relative generalized voltage suppression lasting up to 5 seconds.  Noxious stimuli is applied to extremities later in the tracing, without any appreciable background reactivity noted.  Neither HV or photic stimulation were performed. EKG was monitored and noted to be SR at 96 bpm.  No definite epileptiform changes were noted.  Impression: This is an abnormal EEG due to diffuse background suppression and intermittent rhythmic slowing seen throughout the tracing.  No appreciable background reactivity was noted to painful stimulation.  This is a non-specific finding that can be seen with toxic, metabolic, diffuse, or multifocal structural processes.  No definite epileptiform changes were noted.  The absence of background reactivity in the post-anoxic setting can be a poor prognostic sign. Clinical correlation advised.   Carvel Getting, M.D. Neurology Cell [phone]

## 2017-08-04 NOTE — Progress Notes (Signed)
EEG completed; results pending.    

## 2017-08-04 NOTE — Progress Notes (Signed)
Pharmacy Antibiotic Note  Duane Mcmahon is a 47 y.o. male admitted on 07/27/2017 with pneumonia. Currently on meropenem, now with BAL growing staph aureus. Pharmacy consulted to start back vancomycin. Renal function worsening with sCr up to 2.53 and CrCl 65 ml/min.  He was initially started on vancomycin 1/23. He received a loading dose 2580m on 1/23 at 1226 and then received 12536mon 1/24 at 0200. Vancomycin then discontinued. A random level drawn today (~21 hours after last dose) is therapeutic at 19 (goal 15-20).  Plan: 1) Restart vancomycin 175028mV q24 2) Continue meropenem 2g IV q8 3) Continue to follow renal function, cultures, LOT  Height: _0  (172.7 cm) Weight: (!) 486 lb (220.4 kg) IBW/kg (Calculated) : 68.4  Temp (24hrs), Avg:95.6 F (35.3 C), Min:90.1 F (32.3 C), Max:99 F (37.2 C)  Recent Labs  Lab 07/31/2017 0615 07/15/2017 0830 07/13/2017 0855  08/03/17 0341  08/03/17 1555 08/03/17 1937 08/03/17 2337 08/04/17 0316 08/04/17 1110 08/04/17 1148  WBC 20.3*  --  19.1*  DUPL SEE [id number]  12.0*  --   --   --   --   --   --   --   CREATININE 1.39*  --  0.63   < > 1.81*   < > 1.76* 2.05* 2.11* 2.24*  --  2.53*  LATICACIDVEN  --  3.1*  --   --   --   --   --   --   --   --   --   --   VANCORANDOM  --   --   --   --   --   --   --   --   --   --  19  --    < > = values in this interval not displayed.    Estimated Creatinine Clearance: 65.3 mL/min (A) (by C-G formula based on SCr of 2.53 mg/dL (H)).    Allergies  Allergen Reactions  . Gabapentin   . Penicillins     Antimicrobials this admission: Vancomycin 1/23>>1/24, resume 1/25 > Meropenem 1/23>>  Microbiology results: 1/23BCx: ngtd 1/23UCx: insignificant growth 1/23Sputum (BAL):80k staph aureus 1/23 MRSA PCR: neg   Thank you for allowing pharmacy to be a part of this patient's care.  MarDeboraha Sprang25/2019 1:28 PM

## 2017-08-04 NOTE — Progress Notes (Addendum)
yacPULMONARY / CRITICAL CARE MEDICINE   Name: Duane Mcmahon MRN: [id number] DOB: 02/06/1971    ADMISSION DATE:  08/05/2017 CONSULTATION DATE:  Direct admit from [place] MD:  Dr. Boyce Medici, [facility]  CHIEF COMPLAINT:  Cardiac Arrest   HISTORY OF PRESENT ILLNESS:   HPI obtained from medical chart review as patient is acutely encephalopathic and unable to provide history.    47 [facility] for chronic respiratory failure with hypoxia and hypercarbia, tracheostomy placement, and ventilator dependency.    Has a past medical history of morbid obesity, OSA/OHS, COPD, IDDM, HTN, Afib with RVR, and was a difficult airway at OSH.  During the morning of 1/22, patient had increasing agitation and was given Ativan.  Shortly afterwards he became difficult to ventilate and noted that his 8 XLT trach became dislodged, and patient subsequently developed bradycardia changing to PEA/ asystole.  CPR provided between 0548 - 0611 with ACLS measures with conversion to sinus tachycardia.  His tracheotomy was unable to be placed back.  A 6.5 ETT was placed in his stoma.  After ROSC, patient remained unresponsive.  PCCM called for direct admit to ICU following cardiac arrest.    Prior to cardiac arrest, patient was on fentanyl drip at 100 mcg/hr and cardizem drip at 10 mg/hr.  His baseline mental status was alert and able to follow commands and move all extremities.  His ventilator settings were AC 500/ 20/ 40%/5.    SUBJECTIVE:  Minimally responsive  VITAL SIGNS: BP (!) 116/51   Pulse 100   Temp 98.6 F (37 C)   Resp (!) 25   Ht _0  (1.727 m)   Wt (!) 486 lb (220.4 kg)   SpO2 97%   BMI 73.90 kg/m   HEMODYNAMICS: CVP:  [18 mmHg-23 mmHg] 21 mmHg  VENTILATOR SETTINGS: Vent Mode: PRVC FiO2 (%):  [40 %-70 %] 70 % Set Rate:  [25 bmp] 25 bmp Vt Set:  [410 mL] 410 mL PEEP:  [10 cmH20] 10 cmH20 Plateau  Pressure:  [28 cmH20-38 cmH20] 28 cmH20  INTAKE / OUTPUT:  Intake/Output Summary (Last 24 hours) at 08/04/2017 1100 Last data filed at 08/04/2017 0800 Gross per 24 hour  Intake 1709 ml  Output 525 ml  Net 1184 ml     PHYSICAL EXAMINATION: Gen: MOWM, currently pressor and vent dependent. Cough w/ painful stim  HENT: massive head and neck/ size 6 XLT trach in place.  pulm decreased t/o; equal chest rise Card: RRR decreased  Abd: massive. Hypoactive Ext: chronic venous changes. + anasarca Neuro: cough w/ painful stimm LABS:  BMET Recent Labs  Lab 08/03/17 1937 08/03/17 2337 08/04/17 0316  NA 146* 146* 147*  K 4.4 4.9 5.6*  CL 103 104 104  CO2 32 34* 33*  BUN 59* 64* 59*  CREATININE 2.05* 2.11* 2.24*  GLUCOSE 173* 194* 204*    Electrolytes Recent Labs  Lab 07/14/2017 0615  08/03/17 0341  08/03/17 1937 08/03/17 2337 08/04/17 0316  CALCIUM 9.2   < > 8.8*   < > 8.6* 8.6* 8.5*  MG 3.5*  --  2.8*  --   --   --   --   PHOS  --   --  1.6*  --   --   --   --    < > = values in this interval not displayed.    CBC Recent Labs  Lab 07/16/2017 0615 07/17/2017 0855  07/29/2017  1358 07/21/2017 1611 08/03/17 0341  WBC 20.3* 19.1*  DUPL SEE [id number]  --   --   --  12.0*  HGB 8.2* 7.8*  DUPL SEE [id number]   < > 8.2* 8.5* 7.4*  HCT 31.8* 28.9*  DUPL SEE [id number]   < > 24.0* 25.0* 26.5*  PLT PLATELET CLUMPS NOTED ON SMEAR, UNABLE TO ESTIMATE 116*  DUPL SEE [id number]  --   --   --  136*   < > = values in this interval not displayed.    Coag's Recent Labs  Lab 08/01/2017 0615 07/13/2017 0855 07/12/2017 1652  APTT  --  28 30  INR 1.43 1.46 1.60    Sepsis Markers Recent Labs  Lab 07/15/2017 0830 08/03/17 1056 08/04/17 0316  LATICACIDVEN 3.1*  --   --   PROCALCITON  --  4.49 4.19    ABG Recent Labs  Lab 08/07/2017 1036 08/03/17 0445 08/03/17 1151  PHART 7.255* 7.516* 7.431  PCO2ART 86.8* 42.2 55.1*  PO2ART 158.0* 50.7* 73.0*    Liver Enzymes Recent Labs  Lab 07/31/2017 0615   AST 47*  ALT 26  ALKPHOS 96  BILITOT 1.5*  ALBUMIN 2.9*    Cardiac Enzymes Recent Labs  Lab 08/10/2017 0855 07/24/2017 1953 08/03/17 0152  TROPONINI 0.06* 0.10* 0.09*    Glucose Recent Labs  Lab 08/03/17 1755 08/03/17 1858 08/03/17 1956 08/03/17 2339 08/04/17 0323 08/04/17 0821  GLUCAP 151* 163* 154* 182* 189* 173*    Imaging No results found.   STUDIES:  CT head 1/23 > No acute intracranial process on this motion and habitus limited examination. Lower face subcutaneous emphysema. EEG 1/23 > markedly abnormal due to diffuse background suppression and lack of EEG reactivity with noxious stimulation. (of note, patient deeply sedated with low dose neuromuscular blockade at the time of the this study)  CULTURES: 1/23 BAL: SA 80K>>> UC 1/23: insig growth.  BC 1/23>>>  ANTIBIOTICS: Meropenem 1/23>>> vanc 1/25>>>  SIGNIFICANT EVENTS: 1/22 admit to [facility] from [place] with tracheostomy for OHS with chronic resp failure 1/23 cardiac arrest 23 mins after trach coming dislodged.   LINES/TUBES: 8 XLT preadmit > 1/18 (from outside facility)>>>1/23>>>1/23 (JY)>>> ETT (via trach stoma) 1/23 >>>1/23 LIJ CVL 1/23 > Art line 1/23 >   DISCUSSION: 47 year old super morbidly obese male with history of chronic respiratory failure secondary to OHS. He required tracheostomy for this and was transferred to [facility] for rehabilitation. The following day he suffered a cardiac arrest after his trach had become dislodged. PEA arrest for 23 minutes. He was transferred to ICU at [facility] where he had tracheostomy replaced and was treated with hypothermia protocol 33C.   ASSESSMENT / PLAN:  Acute on chronic hypoxic/hypercarbic respiratory failure in setting of trach dislodgement and subsequent cardiopulmonary arrest  R/o aspiration vs HCAP OHS/OSA  ARDS +/- pulmonary edema  COPD without acute exacerbation Plan Cont full vent support  PAD protocol RAS goal 0 Day # 3  meropenem  Will add vanc back (prelim 80K SA from BAL) Wean peep/FIO2 Eventually will need diuresis   PEA arrest: likely primary respiratory arrest. Troponin peaked at 0.1 Persistent circulatory shock: cardiogenic s/p arrest +/- sepsis Atrial fibrillation with RVR - remains sinus here.  CHF probable cor pulmonale reported from recent admission.  H/o HTN Plan Cont to wean pressors for MAP > 65 KVO IVFs Cont tele   AKI -seems volume overloaded; cr rising  Plan Cont MAP goal > 65 Renal dose meds Will d/w  family: he is poor HD candidate   Fluid and electrolyte imbalance: hyperkalemia, hypernatremia  Plan Repeat chemistry to assure K does not reflect hemolysis  If K still elevated ->kayexalate Add free water  Repeat chem in am  Acute anoxic encephalopathy Off nimbex since 8/24 at 2200 EEG 1/23: c/w anoxic encephalopathy  CT head neg  Plan Rass goal 0 PRN fent Repeat EEG given off sedation Will need Neuro eval for prognostics  Anemia w/out evidence of bleeding Plan Trend cbc  Insulin dependent diabetes mellitus  Plan ssi  DVT prophylaxis: [place] heparin SUP: ppi  Diet: npo-->tubefeed 1/26 Activity: BR Disposition : icu  FAMILY  - Updates: Family supposedly coming in later today. Updated yesterday by Dr. Nelda Marseille  - Inter-disciplinary family meet or Palliative Care meeting due by:  1/30 Erick Colace ACNP-BC [place] Pager # [phone] OR # [phone] if no answer  Attending Note:  47 year old male with PMH of super morbid obesity who presents to the hospital from [facility] after a cardiac arrest and dislodging his tracheostomy.  Patient was emergently trached bedside after loosing his airway.  On exam, he remains completely unresponsive with only a respiratory drive.  I reviewed CXR myself, trach is in good position.  Discussed with PCCM-NP.  Will involve neurology for prognostication but family on admission relayed that patient did not want to be kep  alive on machines.  Will await family arrival for further discussion but partial code for now.  The patient is critically ill with multiple organ systems failure and requires high complexity decision making for assessment and support, frequent evaluation and titration of therapies, application of advanced monitoring technologies and extensive interpretation of multiple databases.   Critical Care Time devoted to patient care services described in this note is  35  Minutes. This time reflects time of care of this signee Dr [name]. This critical care time does not reflect procedure time, or teaching time or supervisory time of PA/NP/Med student/Med Resident etc but could involve care discussion time.  Rush Farmer, M.D. [facility] Pulmonary/Critical Care Medicine. Pager: [phone]. After hours pager: [phone].

## 2017-08-05 LAB — GLUCOSE, CAPILLARY
GLUCOSE-CAPILLARY: 183 mg/dL — AB (ref 65–99)
Glucose-Capillary: 123 mg/dL — ABNORMAL HIGH (ref 65–99)
Glucose-Capillary: 173 mg/dL — ABNORMAL HIGH (ref 65–99)
Glucose-Capillary: 176 mg/dL — ABNORMAL HIGH (ref 65–99)
Glucose-Capillary: 181 mg/dL — ABNORMAL HIGH (ref 65–99)
Glucose-Capillary: 190 mg/dL — ABNORMAL HIGH (ref 65–99)
Glucose-Capillary: 204 mg/dL — ABNORMAL HIGH (ref 65–99)

## 2017-08-05 LAB — CBC
HCT: 27.6 % — ABNORMAL LOW (ref 39.0–52.0)
Hemoglobin: 7.3 g/dL — ABNORMAL LOW (ref 13.0–17.0)
MCH: 25.3 pg — ABNORMAL LOW (ref 26.0–34.0)
MCHC: 26.4 g/dL — ABNORMAL LOW (ref 30.0–36.0)
MCV: 95.8 fL (ref 78.0–100.0)
PLATELETS: 135 10*3/uL — AB (ref 150–400)
RBC: 2.88 MIL/uL — AB (ref 4.22–5.81)
RDW: 17.6 % — AB (ref 11.5–15.5)
WBC: 9.6 10*3/uL (ref 4.0–10.5)

## 2017-08-05 LAB — BASIC METABOLIC PANEL
Anion gap: 11 (ref 5–15)
BUN: 64 mg/dL — AB (ref 6–20)
CO2: 32 mmol/L (ref 22–32)
CREATININE: 2.71 mg/dL — AB (ref 0.61–1.24)
Calcium: 8.7 mg/dL — ABNORMAL LOW (ref 8.9–10.3)
Chloride: 103 mmol/L (ref 101–111)
GFR, EST AFRICAN AMERICAN: 30 mL/min — AB (ref >60)
GFR, EST NON AFRICAN AMERICAN: 26 mL/min — AB (ref >60)
Glucose, Bld: 201 mg/dL — ABNORMAL HIGH (ref 65–99)
Potassium: 5.9 mmol/L — ABNORMAL HIGH (ref 3.5–5.1)
SODIUM: 146 mmol/L — AB (ref 135–145)

## 2017-08-05 LAB — CULTURE, BAL-QUANTITATIVE W GRAM STAIN

## 2017-08-05 LAB — PROCALCITONIN: Procalcitonin: 2.7 ng/mL

## 2017-08-05 LAB — CULTURE, BAL-QUANTITATIVE

## 2017-08-05 MED ORDER — CEFAZOLIN SODIUM-DEXTROSE 1-4 GM/50ML-% IV SOLN
1.0000 g | Freq: Three times a day (TID) | INTRAVENOUS | Status: DC
  Administered 2017-08-05 – 2017-08-06 (×4): 1 g via INTRAVENOUS
  Filled 2017-08-05 (×6): qty 50

## 2017-08-05 MED ORDER — SODIUM CHLORIDE 0.45 % IV SOLN
INTRAVENOUS | Status: DC
  Administered 2017-08-05: 50 mL/h via INTRAVENOUS
  Administered 2017-08-06: 09:00:00 via INTRAVENOUS

## 2017-08-05 MED ORDER — SODIUM POLYSTYRENE SULFONATE PO POWD
15.0000 g | Freq: Once | ORAL | Status: AC
  Administered 2017-08-05: 15 g via ORAL
  Filled 2017-08-05: qty 15

## 2017-08-05 MED ORDER — SODIUM POLYSTYRENE SULFONATE 15 GM/60ML PO SUSP: 15.0000 g | Freq: Once | ORAL | Status: DC

## 2017-08-05 NOTE — Progress Notes (Signed)
50ml Versed wasted in sink with Youlanda MightyJesse C, RN

## 2017-08-05 NOTE — Progress Notes (Signed)
Neurology Progress Note   S:// Seen and examined.  O:// Current vital signs: BP (!) 124/37   Pulse 81   Temp 99 F (37.2 C)   Resp (!) 25   Ht 5' 8"  (1.727 m)   Wt (!) 220.4 kg (486 lb)   SpO2 98%   BMI 73.90 kg/m   Vital signs in last 24 hours: Temp:  [98.6 F (37 C)-99.1 F (37.3 C)] 99 F (37.2 C) (01/26 0700) Pulse Rate:  [80-100] 81 (01/26 0700) Resp:  [13-27] 25 (01/26 0700) BP: (104-155)/(26-104) 124/37 (01/26 0700) SpO2:  [92 %-100 %] 98 % (01/26 0737) Arterial Line BP: (108-164)/(38-63) 136/53 (01/26 0700) FiO2 (%):  [60 %-70 %] 60 % (01/26 0737) Neurological Examination remains unchanged  Mental Status: Patient does not respond to verbal stimuli.  Does not respond to deep sternal rub.  Does not follow commands.  No verbalizations noted.  Cranial Nerves: II: patient does not respond confrontation bilaterally, pupils right 2 mm, left 2 mm,and reactive bilaterally III,IV,VI: doll's response absent bilaterally.  V,VII: corneal reflex present bilaterally  VIII: patient does not respond to verbal stimuli IX,X: gag reflex reduced, XI: trapezius strength unable to test bilaterally XII: tongue strength unable to test Motor: Extremities flaccid throughout.  No spontaneous movement noted.  No purposeful movements noted. Sensory: Does not respond to noxious stimuli in any extremity. Deep Tendon Reflexes:  Absent throughout. Plantars: absent bilaterally Cerebellar: Unable to perform  Medications  Current Facility-Administered Medications:  .  0.9 %  sodium chloride infusion, , Intravenous, Continuous, Corey Harold, NP, Last Rate: 10 mL/hr at 08/05/17 0600 .  budesonide (PULMICORT) nebulizer solution 0.5 mg, 0.5 mg, Nebulization, BID, Corey Harold, NP, 0.5 mg at 08/05/17 0737 .  chlorhexidine gluconate (MEDLINE KIT) (PERIDEX) 0.12 % solution 15 mL, 15 mL, Mouth Rinse, BID, Rush Farmer, MD, 15 mL at 08/05/17 0751 .  Chlorhexidine Gluconate Cloth 2 % PADS  6 each, 6 each, Topical, Daily, Rush Farmer, MD, 6 each at 08/04/17 0125 .  free water 200 mL, 200 mL, Per Tube, Q4H, Erick Colace, NP, 200 mL at 08/05/17 0752 .  heparin injection 5,000 Units, 5,000 Units, Subcutaneous, Q8H, Corey Harold, NP, 5,000 Units at 08/05/17 0553 .  insulin aspart (novoLOG) injection 2-6 Units, 2-6 Units, Subcutaneous, Q4H, Rush Farmer, MD, 2 Units at 08/05/17 0751 .  insulin glargine (LANTUS) injection 30 Units, 30 Units, Subcutaneous, Q24H, Rush Farmer, MD, 30 Units at 08/04/17 1851 .  ipratropium-albuterol (DUONEB) 0.5-2.5 (3) MG/3ML nebulizer solution 3 mL, 3 mL, Nebulization, Q6H, Corey Harold, NP, 3 mL at 08/05/17 0736 .  MEDLINE mouth rinse, 15 mL, Mouth Rinse, 10 times per day, Rush Farmer, MD, 15 mL at 08/05/17 0549 .  meropenem (MERREM) 2 g in sodium chloride 0.9 % 100 mL IVPB, 2 g, Intravenous, Q8H, Rush Farmer, MD, Stopped at 08/05/17 [id number] .  midazolam (VERSED) 50 mg in sodium chloride 0.9 % 50 mL (1 mg/mL) infusion, 2-10 mg/hr, Intravenous, Continuous, Corey Harold, NP, Stopped at 08/04/17 0202 .  midazolam (VERSED) bolus via infusion 1 mg, 1 mg, Intravenous, Q30 min PRN, Corey Harold, NP .  norepinephrine (LEVOPHED) 16 mg in dextrose 5 % 250 mL (0.064 mg/mL) infusion, 0-50 mcg/min, Intravenous, Titrated, Rush Farmer, MD, Last Rate: 26.3 mL/hr at 08/05/17 0600, 28.053 mcg/min at 08/05/17 0600 .  pantoprazole (PROTONIX) injection 40 mg, 40 mg, Intravenous, QHS, Corey Harold, NP, 40 mg  at 08/04/17 2234 .  sodium chloride flush (NS) 0.9 % injection 10-40 mL, 10-40 mL, Intracatheter, Q12H, Rush Farmer, MD, 10 mL at 08/04/17 2250 .  sodium chloride flush (NS) 0.9 % injection 10-40 mL, 10-40 mL, Intracatheter, PRN, Rush Farmer, MD .  vancomycin (VANCOCIN) 1,750 mg in sodium chloride 0.9 % 500 mL IVPB, 1,750 mg, Intravenous, Q24H, Otilio Miu, RPH, Stopped at 08/04/17 2259 Labs CBC    Component Value Date/Time    WBC 9.6 08/05/2017 0310   RBC 2.88 (L) 08/05/2017 0310   HGB 7.3 (L) 08/05/2017 0310   HCT 27.6 (L) 08/05/2017 0310   PLT 135 (L) 08/05/2017 0310   MCV 95.8 08/05/2017 0310   MCH 25.3 (L) 08/05/2017 0310   MCHC 26.4 (L) 08/05/2017 0310   RDW 17.6 (H) 08/05/2017 0310   LYMPHSABS PENDING 08/05/2017 0855   LYMPHSABS 1.9 07/18/2017 0855   MONOABS PENDING 07/20/2017 0855   MONOABS 1.1 (H) 07/20/2017 0855   EOSABS PENDING 07/12/2017 0855   EOSABS 0.0 08/07/2017 0855   BASOSABS PENDING 07/11/2017 0855   BASOSABS 0.0 08/01/2017 0855    CMP     Component Value Date/Time   NA 146 (H) 08/05/2017 0310   K 5.9 (H) 08/05/2017 0310   CL 103 08/05/2017 0310   CO2 32 08/05/2017 0310   GLUCOSE 201 (H) 08/05/2017 0310   BUN 64 (H) 08/05/2017 0310   CREATININE 2.71 (H) 08/05/2017 0310   CALCIUM 8.7 (L) 08/05/2017 0310   PROT 8.7 (H) 08/08/2017 0615   ALBUMIN 2.9 (L) 07/18/2017 0615   AST 47 (H) 07/27/2017 0615   ALT 26 07/28/2017 0615   ALKPHOS 96 08/08/2017 0615   BILITOT 1.5 (H) 07/30/2017 0615   GFRNONAA 26 (L) 08/05/2017 0310   GFRAA 30 (L) 08/05/2017 0310   Imaging I have reviewed images in epic and the results pertinent to this consultation are: CT-scan of the brain -initial CT head was motion riddled, and due to his large body habitus difficult to interpret but did not show any acute intraconal process.  A repeat CT scan done today also remains unrevealing.  Assessment:  47 year old man with a past history of morbid obesity, obstructive sleep apnea, COPD, diabetes, hypertension, A. fib with RVR [facility] with hypoxia hypercapnia and had tracheostomy placed and is vent dependent.  On January 22 found to be increasingly agitated and got Ativan shortly after which his trach became dislodged and he developed bradycardia and went into PEA and asystole.  Approximately 20 minutes of downtime before return of spontaneous  circulation. Treated at [facility] ICU with hypothermia protocol and rewarmed and reached normothermia around 0200 hrs. on 08/04/2017.  Sedation has been off since. His neurological exam remains poor off of sedation, even after more than 24 hours of being normothermic. Although it is difficult to provide accurate prognostication, at least for 72 hours after hypothermia protocol, given that his baseline is very poor and he is very debilitated at baseline along with vent dependency and other comorbidities, I would expect that he will not have a good prognosis in terms of neurologically meaningful recovery.  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  Recommendations: As mentioned above, it is difficult to prognosticate someone who is coming off of the hypothermia protocol for at least 72 hours after hypothermia has not much is known about the pharmacokinetics and dynamics of the sedating  medications during hypothermia, it is reasonable to say that he will likely not have a good prognosis in terms of neurologically meaningful recovery given his poor baseline and multiple comorbidities. The IC team is in the process of having conversations with the family regarding goals of care which I think is an appropriate step. Neurology services will be available as needed. Please call me with questions.  -- Amie Portland, MD [facility] Pager: [phone] If 7pm to 7am, please call on call as listed on AMION.  CRITICAL CARE ATTESTATION This patient is critically ill and at significant risk of neurological worsening, death and care requires constant monitoring of vital signs, hemodynamics,respiratory and cardiac monitoring. I spent 30  minutes of neurocritical care time performing neurological assessment, discussion with family, other specialists and medical decision making of high complexityin the care  of  this patient.

## 2017-08-05 NOTE — Progress Notes (Signed)
Pharmacy Antibiotic Note  Duane Mcmahon is a 47 y.o. male admitted on 07/26/2017 with pneumonia. Currently on meropenem, now with BAL growing staph aureus. Pharmacy consulted to start back vancomycin. BAL culture returned as MSSA so narrowing to cefazolin monotherapy. Pt has noted unknown PCN allergy, will monitor closely for allergic reaction.  Plan: -Stop vancomycin and meropenem -Begin cefazolin 1g IV q8h  Height: _0  (172.7 cm) Weight: (!) 486 lb (220.4 kg) IBW/kg (Calculated) : 68.4  Temp (24hrs), Avg:99 F (37.2 C), Min:98.6 F (37 C), Max:99.5 F (37.5 C)  Recent Labs  Lab 08/01/2017 0615 07/24/2017 0830 08/05/2017 0855  08/03/17 0341  08/03/17 1937 08/03/17 2337 08/04/17 0316 08/04/17 1110 08/04/17 1148 08/05/17 0310  WBC 20.3*  --  19.1*  DUPL SEE [id number]  --  12.0*  --   --   --   --   --   --  9.6  CREATININE 1.39*  --  0.63   < > 1.81*   < > 2.05* 2.11* 2.24*  --  2.53* 2.71*  LATICACIDVEN  --  3.1*  --   --   --   --   --   --   --   --   --   --   VANCORANDOM  --   --   --   --   --   --   --   --   --  19  --   --    < > = values in this interval not displayed.    Estimated Creatinine Clearance: 60.9 mL/min (A) (by C-G formula based on SCr of 2.71 mg/dL (H)).    Allergies  Allergen Reactions  . Gabapentin   . Penicillins     Antimicrobials this admission: Cefazolin 1/26 >> Vancomycin 1/23>>1/24, 1/25 > 1/26 Meropenem 1/23>>1/26  Microbiology results: 1/23BCx: ngtd 1/23UCx: insignificant growth 1/23Sputum (BAL):80k MSSA 1/23 MRSA PCR: neg   Thank you for allowing pharmacy to be a part of this patient's care.  Arrie Senate, PharmD, BCPS PGY-2 Cardiology Pharmacy Resident Pager: [phone] 08/05/2017

## 2017-08-05 NOTE — Progress Notes (Signed)
50ml Versed wasted in sink with Celenia Sosa RN.

## 2017-08-05 NOTE — Progress Notes (Signed)
Notified Elink RN of mornings labs. Elink RN to notify MD. No new orders at this time.

## 2017-08-05 NOTE — Progress Notes (Signed)
Pads disconnected for CT. When re-attatching pads to arctic sun machine air leak error persisted. Company notified and attempted to trouble shoot issue. Recommendation to discontinue or replace pads. Pt temp stable at this time. Will discontinue pads for time being. Elink RN, Loraine LericheMark to notify MD. Continuing current plan of care. Will continue to monitor.

## 2017-08-05 NOTE — Progress Notes (Signed)
yacPULMONARY / CRITICAL CARE MEDICINE   Name: Duane Mcmahon MRN: [id number] DOB: 1971/05/14    ADMISSION DATE:  08/05/2017 CONSULTATION DATE:  Direct admit from [place] MD:  Dr. Boyce Medici, [facility]  CHIEF COMPLAINT:  Cardiac Arrest   HISTORY OF PRESENT ILLNESS:   HPI obtained from medical chart review as patient is acutely encephalopathic and unable to provide history.    47 [facility] for chronic respiratory failure with hypoxia and hypercarbia, tracheostomy placement, and ventilator dependency.    Has a past medical history of morbid obesity, OSA/OHS, COPD, IDDM, HTN, Afib with RVR, and was a difficult airway at OSH.  During the morning of 1/22, patient had increasing agitation and was given Ativan.  Shortly afterwards he became difficult to ventilate and noted that his 8 XLT trach became dislodged, and patient subsequently developed bradycardia changing to PEA/ asystole.  CPR provided between 0548 - 0611 with ACLS measures with conversion to sinus tachycardia.  His tracheotomy was unable to be placed back.  A 6.5 ETT was placed in his stoma.  After ROSC, patient remained unresponsive.  PCCM called for direct admit to ICU following cardiac arrest.    Prior to cardiac arrest, patient was on fentanyl drip at 100 mcg/hr and cardizem drip at 10 mg/hr.  His baseline mental status was alert and able to follow commands and move all extremities.  His ventilator settings were AC 500/ 20/ 40%/5.    SUBJECTIVE:  Comatose Afebrile On levo gtt  VITAL SIGNS: BP (!) 126/40   Pulse 85   Temp 99.5 F (37.5 C)   Resp (!) 22   Ht _0  (1.727 m)   Wt (!) 486 lb (220.4 kg)   SpO2 98%   BMI 73.90 kg/m   HEMODYNAMICS: CVP:  [18 mmHg-29 mmHg] 25 mmHg  VENTILATOR SETTINGS: Vent Mode: PRVC FiO2 (%):  [60 %-70 %] 60 % Set Rate:  [25 bmp] 25 bmp Vt Set:  [410 mL] 410 mL PEEP:  [10 cmH20] 10  cmH20 Plateau Pressure:  [30 cmH20-34 cmH20] 34 cmH20  INTAKE / OUTPUT:  Intake/Output Summary (Last 24 hours) at 08/05/2017 1104 Last data filed at 08/05/2017 1000 Gross per 24 hour  Intake 1878.3 ml  Output 1565 ml  Net 313.3 ml     PHYSICAL EXAMINATION: Gen: MOWM, currently pressor and vent dependent. Cough w/ painful stim  HENT: massive head and neck/ size 6 XLT trach in place.  pulm decreased t/o; equal chest rise Card: RRR decreased  Abd: massive. Hypoactive Ext: chronic venous changes. + anasarca Neuro: cough w/ painful stimm LABS:  BMET Recent Labs  Lab 08/04/17 0316 08/04/17 1148 08/05/17 0310  NA 147* 146* 146*  K 5.6* 5.9* 5.9*  CL 104 105 103  CO2 33* 34* 32  BUN 59* 62* 64*  CREATININE 2.24* 2.53* 2.71*  GLUCOSE 204* 191* 201*    Electrolytes Recent Labs  Lab 07/12/2017 0615  08/03/17 0341  08/04/17 0316 08/04/17 1148 08/05/17 0310  CALCIUM 9.2   < > 8.8*   < > 8.5* 8.5* 8.7*  MG 3.5*  --  2.8*  --   --   --   --   PHOS  --   --  1.6*  --   --   --   --    < > = values in this interval not displayed.    CBC Recent Labs  Lab 07/22/2017 347 365 [id number]  08/09/2017 1611 08/03/17 0341 08/05/17 0310  WBC 19.1*  DUPL SEE [id number]  --   --  12.0* 9.6  HGB 7.8*  DUPL SEE [id number]   < > 8.5* 7.4* 7.3*  HCT 28.9*  DUPL SEE [id number]   < > 25.0* 26.5* 27.6*  PLT 116*  DUPL SEE [id number]  --   --  136* 135*   < > = values in this interval not displayed.    Coag's Recent Labs  Lab 07/17/2017 0615 08/01/2017 0855 07/29/2017 1652  APTT  --  28 30  INR 1.43 1.46 1.60    Sepsis Markers Recent Labs  Lab 08/01/2017 0830 08/03/17 1056 08/04/17 0316 08/05/17 0310  LATICACIDVEN 3.1*  --   --   --   PROCALCITON  --  4.49 4.19 2.70    ABG Recent Labs  Lab 07/20/2017 1036 08/03/17 0445 08/03/17 1151  PHART 7.255* 7.516* 7.431  PCO2ART 86.8* 42.2 55.1*  PO2ART 158.0* 50.7* 73.0*    Liver Enzymes Recent Labs  Lab 07/30/2017 0615  AST 47*  ALT 26  ALKPHOS 96   BILITOT 1.5*  ALBUMIN 2.9*    Cardiac Enzymes Recent Labs  Lab 07/20/2017 0855 07/30/2017 1953 08/03/17 0152  TROPONINI 0.06* 0.10* 0.09*    Glucose Recent Labs  Lab 08/04/17 0821 08/04/17 1245 08/04/17 1632 08/04/17 2024 08/04/17 2355 08/05/17 0422  GLUCAP 173* 165* 172* 177* 173* 183*    Imaging Ct Head Wo Contrast  Result Date: 08/04/2017 CLINICAL DATA:  Suspected hypoxic anoxic brain injury EXAM: CT HEAD WITHOUT CONTRAST TECHNIQUE: Contiguous axial images were obtained from the base of the skull through the vertex without intravenous contrast. COMPARISON:  Head CT 08/01/2017 FINDINGS: Brain: No mass lesion, intraparenchymal hemorrhage or extra-axial collection. No evidence of acute cortical infarct. Brain parenchyma and CSF-containing spaces are normal for age. Vascular: No hyperdense vessel or unexpected calcification. Skull: Normal visualized skull base, calvarium and extracranial soft tissues. Sinuses/Orbits: Bilateral mastoid effusions. Near complete opacification of the visualized paranasal sinuses. Normal orbits. IMPRESSION: No acute intracranial abnormality. Electronically Signed   By: Ulyses Jarred M.D.   On: 08/04/2017 18:38     STUDIES:  CT head 1/23 > No acute intracranial process on this motion and habitus limited examination. Lower face subcutaneous emphysema. EEG 1/23 > markedly abnormal due to diffuse background suppression and lack of EEG reactivity with noxious stimulation. (of note, patient deeply sedated with low dose neuromuscular blockade at the time of the this study)  CULTURES: 1/23 BAL: MSSA 80K  UC 1/23: insig growth.  BC 1/23>>>ng  ANTIBIOTICS: Meropenem 1/23>>> vanc 1/25>>> 1/26  SIGNIFICANT EVENTS: 1/22 admit to [facility] from [place] with tracheostomy for OHS with chronic resp failure 1/23 cardiac arrest 23 mins after trach coming dislodged.   LINES/TUBES: 8 XLT preadmit > 1/18 (from outside facility)>>>1/23>>>1/23 (JY)>>> ETT (via trach  stoma) 1/23 >>>1/23 LIJ CVL 1/23 > Art line 1/23 >   DISCUSSION: 47 year old super morbidly obese male with history of chronic respiratory failure secondary to OHS. He required tracheostomy for this and was transferred to [facility] for rehabilitation. The following day he suffered a resp/ cardiac arrest after his trach had become dislodged. PEA arrest for 23 minutes. He was transferred to ICU at [facility] where he had tracheostomy replaced and was treated with hypothermia protocol 33C.   ASSESSMENT / PLAN:  Acute on chronic hypoxic/hypercarbic respiratory failure in setting of trach dislodgement and subsequent cardiopulmonary arrest  OHS/OSA  ARDS +/- pulmonary edema  COPD without acute exacerbation Plan Cont full vent support  Wean peep/FIO2   PEA arrest: likely primary respiratory arrest. Troponin peaked at 0.1 Persistent circulatory shock: cardiogenic s/p arrest +/- sepsis Atrial fibrillation with RVR - remains sinus here.  CHF probable cor pulmonale reported from recent admission.  H/o HTN Plan Cont to wean levophed  for MAP > 65  Cont tele   AKI -seems volume overloaded; cr rising  Plan restart 1/2Ns @ 50/h Renal dose meds NOT  HD candidate   Hyperkalemia, hypernatremia  Plan Rpt kayexalate Ct  free water  Repeat chem in am  R/o aspiration vs HCAP -MSSA -dc meropenem/ vanc -use cefazolin istead  Acute anoxic encephalopathy -rewarmed 1/25 EEG 1/23: c/w anoxic encephalopathy  CT head neg  Plan Rass goal 0 PRN fent Await 72 h p-rewarm to prognosticate accurately, neuro assisting  Anemia w/out evidence of bleeding Plan Trend cbc  Insulin dependent diabetes mellitus  Plan ssi  DVT prophylaxis: [place] heparin SUP: ppi  Diet: tubefeed 1/26 Activity: BR Disposition : icu  FAMILY  - Updates:  Updated 1/25  - Inter-disciplinary family meet or Palliative Care meeting due by:  1/25 - care limitations placed , DNR, no HD   The patient is  critically ill with multiple organ systems failure and requires high complexity decision making for assessment and support, frequent evaluation and titration of therapies, application of advanced monitoring technologies and extensive interpretation of multiple databases. Critical Care Time devoted to patient care services described in this note independent of APP/resident  time is 32 minutes.   [name] Elsworth Soho MD

## 2017-08-06 ENCOUNTER — Inpatient Hospital Stay (HOSPITAL_COMMUNITY): Payer: Medicare Other

## 2017-08-06 LAB — BASIC METABOLIC PANEL
ANION GAP: 10 (ref 5–15)
BUN: 60 mg/dL — ABNORMAL HIGH (ref 6–20)
CHLORIDE: 103 mmol/L (ref 101–111)
CO2: 32 mmol/L (ref 22–32)
Calcium: 8.5 mg/dL — ABNORMAL LOW (ref 8.9–10.3)
Creatinine, Ser: 2.1 mg/dL — ABNORMAL HIGH (ref 0.61–1.24)
GFR calc Af Amer: 41 mL/min — ABNORMAL LOW (ref >60)
GFR, EST NON AFRICAN AMERICAN: 36 mL/min — AB (ref >60)
GLUCOSE: 191 mg/dL — AB (ref 65–99)
POTASSIUM: 5.5 mmol/L — AB (ref 3.5–5.1)
Sodium: 145 mmol/L (ref 135–145)

## 2017-08-06 LAB — CBC
HCT: 26.8 % — ABNORMAL LOW (ref 39.0–52.0)
HEMOGLOBIN: 7.2 g/dL — AB (ref 13.0–17.0)
MCH: 25.5 pg — ABNORMAL LOW (ref 26.0–34.0)
MCHC: 26.9 g/dL — ABNORMAL LOW (ref 30.0–36.0)
MCV: 95 fL (ref 78.0–100.0)
PLATELETS: 109 10*3/uL — AB (ref 150–400)
RBC: 2.82 MIL/uL — AB (ref 4.22–5.81)
RDW: 17.9 % — ABNORMAL HIGH (ref 11.5–15.5)
WBC: 8.6 10*3/uL (ref 4.0–10.5)

## 2017-08-06 LAB — GLUCOSE, CAPILLARY
GLUCOSE-CAPILLARY: 187 mg/dL — AB (ref 65–99)
GLUCOSE-CAPILLARY: 176 mg/dL — AB (ref 65–99)
Glucose-Capillary: 159 mg/dL — ABNORMAL HIGH (ref 65–99)

## 2017-08-06 LAB — MAGNESIUM: Magnesium: 2.7 mg/dL — ABNORMAL HIGH (ref 1.7–2.4)

## 2017-08-06 LAB — PHOSPHORUS: Phosphorus: 4.6 mg/dL (ref 2.5–4.6)

## 2017-08-06 MED ORDER — SODIUM CHLORIDE 0.9 % IV SOLN
10.0000 mg/h | INTRAVENOUS | Status: DC
  Administered 2017-08-06: 10 mg/h via INTRAVENOUS
  Filled 2017-08-06: qty 10

## 2017-08-06 MED ORDER — MORPHINE BOLUS VIA INFUSION
5.0000 mg | INTRAVENOUS | Status: DC | PRN
  Administered 2017-08-06: 5 mg via INTRAVENOUS
  Filled 2017-08-06: qty 20

## 2017-08-07 LAB — CULTURE, BLOOD (ROUTINE X 2)
CULTURE: NO GROWTH
CULTURE: NO GROWTH
Special Requests: ADEQUATE
Special Requests: ADEQUATE

## 2017-08-10 ENCOUNTER — Telehealth: Payer: Self-pay

## 2017-08-10 NOTE — Telephone Encounter (Signed)
On 08/10/17 I received a d/c from [employer] (faxed). The d/c is for cremation. The patient is a patient of Doctor Vassie Lolllva. The d/c will be taken to Pulmonary Unit @ [facility] for signature.  On 08/10/17 I received the d/c back from Doctor Vassie LollAlva. I got the d/c ready and faxed the d/c to the funeral home per the funeral home request.

## 2017-08-11 NOTE — Progress Notes (Signed)
Morphine drip wasted down sink, 215ml. Witnessed by Hessie DibbleKristen Lanier RN.

## 2017-08-11 NOTE — Progress Notes (Signed)
Pt is a DNR and family decided to take off venitlator.  Pt has trach in place and currently on RA.   Pt has a morphine drip for comfort.  Family and RN at bedside.  RT will continue to monitor.

## 2017-08-11 NOTE — Progress Notes (Signed)
Family would like to withdraw care when brother arrives this evening. RN has paged Dr. Vassie LollAlva and is waiting for return call.

## 2017-08-11 NOTE — Progress Notes (Signed)
Time of death is [id number]. Confirmation with Cellenia RN. CDS notified. E link notified.

## 2017-08-11 NOTE — Progress Notes (Signed)
Family arrived and have decided to go to comfort care. Wife states that she and the patient have had the conversation before and it was his wish to not be kept on life support. They would like to withdraw care when all available family members have arrived. Dr. Vassie LollAlva spoke with the family. He will be changing orders and has stopped fluids at this time. Information has been given to the wife concerning local establishments that offer cremation services.

## 2017-08-11 NOTE — Progress Notes (Signed)
yacPULMONARY / CRITICAL CARE MEDICINE   Name: Duane Mcmahon MRN: [id number] DOB: 06/23/1971    ADMISSION DATE:  08/08/2017 CONSULTATION DATE:  Direct admit from [place] MD:  Dr. Boyce Medici, [facility]  CHIEF COMPLAINT:  Cardiac Arrest   HISTORY OF PRESENT ILLNESS:   HPI obtained from medical chart review as patient is acutely encephalopathic and unable to provide history.    47 [facility] for chronic respiratory failure with hypoxia and hypercarbia, tracheostomy placement, and ventilator dependency.    Has a past medical history of morbid obesity, OSA/OHS, COPD, IDDM, HTN, Afib with RVR, and was a difficult airway at OSH.  During the morning of 1/22, patient had increasing agitation and was given Ativan.  Shortly afterwards he became difficult to ventilate and noted that his 8 XLT trach became dislodged, and patient subsequently developed bradycardia changing to PEA/ asystole.  CPR provided between 0548 - 0611 with ACLS measures with conversion to sinus tachycardia.  His tracheotomy was unable to be placed back.  A 6.5 ETT was placed in his stoma.  After ROSC, patient remained unresponsive.  PCCM called for direct admit to ICU following cardiac arrest.    Prior to cardiac arrest, patient was on fentanyl drip at 100 mcg/hr and cardizem drip at 10 mg/hr.  His baseline mental status was alert and able to follow commands and move all extremities.  His ventilator settings were AC 500/ 20/ 40%/5.    SUBJECTIVE:  Remains Comatose Afebrile On levo gtt , decreasing Good UO  VITAL SIGNS: BP (!) 122/49   Pulse 71   Temp 97.8 F (36.6 C) (Oral)   Resp (!) 22   Ht _0  (1.727 m)   Wt (!) 486 lb (220.4 kg)   SpO2 98%   BMI 73.90 kg/m   HEMODYNAMICS: CVP:  [21 mmHg-26 mmHg] 24 mmHg  VENTILATOR SETTINGS: Vent Mode: PRVC FiO2 (%):  [50 %] 50 % Set Rate:  [25 bmp] 25 bmp Vt Set:  [410 mL]  410 mL PEEP:  [10 cmH20] 10 cmH20 Plateau Pressure:  [26 cmH20-35 cmH20] 31 cmH20  INTAKE / OUTPUT:  Intake/Output Summary (Last 24 hours) at 08-23-2017 1205 Last data filed at 08/23/2017 1159 Gross per 24 hour  Intake 2353.76 ml  Output 2255 ml  Net 98.76 ml     PHYSICAL EXAMINATION: Gen: obese , on vent .  HENT: massive head and neck/ size 6 XLT trach, no secretions pulm decreased t/o; equal chest rise Card: RRR decreased  ZDG:UYQIHKVQ obese  Hypoactive Ext: chronic venous changes. + anasarca Neuro: cough w/ painful stimm LABS:  BMET Recent Labs  Lab 08/04/17 1148 08/05/17 0310 08-23-2017 0417  NA 146* 146* 145  K 5.9* 5.9* 5.5*  CL 105 103 103  CO2 34* 32 32  BUN 62* 64* 60*  CREATININE 2.53* 2.71* 2.10*  GLUCOSE 191* 201* 191*    Electrolytes Recent Labs  Lab 08/05/2017 0615  08/03/17 0341  08/04/17 1148 08/05/17 0310 2017-08-23 0417  CALCIUM 9.2   < > 8.8*   < > 8.5* 8.7* 8.5*  MG 3.5*  --  2.8*  --   --   --  2.7*  PHOS  --   --  1.6*  --   --   --  4.6   < > = values in this interval not displayed.    CBC Recent Labs  Lab 08/03/17 0341 08/05/17 0310 23-Aug-2017 0417  WBC 12.0* 9.6 8.6  HGB 7.4* 7.3* 7.2*  HCT 26.5* 27.6* 26.8*  PLT 136* 135* 109*    Coag's Recent Labs  Lab 08/07/2017 0615 07/20/2017 0855 08/07/2017 1652  APTT  --  28 30  INR 1.43 1.46 1.60    Sepsis Markers Recent Labs  Lab 07/31/2017 0830 08/03/17 1056 08/04/17 0316 08/05/17 0310  LATICACIDVEN 3.1*  --   --   --   PROCALCITON  --  4.49 4.19 2.70    ABG Recent Labs  Lab 07/20/2017 1036 08/03/17 0445 08/03/17 1151  PHART 7.255* 7.516* 7.431  PCO2ART 86.8* 42.2 55.1*  PO2ART 158.0* 50.7* 73.0*    Liver Enzymes Recent Labs  Lab 07/29/2017 0615  AST 47*  ALT 26  ALKPHOS 96  BILITOT 1.5*  ALBUMIN 2.9*    Cardiac Enzymes Recent Labs  Lab 07/18/2017 0855 07/22/2017 1953 08/03/17 0152  TROPONINI 0.06* 0.10* 0.09*    Glucose Recent Labs  Lab 08/05/17 1539  08/05/17 1958 08/05/17 2332 08-29-2017 0319 08-29-2017 0758 2017/08/29 1140  GLUCAP 190* 181* 176* 187* 176* 159*    Imaging Dg Chest Port 1 View  Result Date: 08-29-2017 CLINICAL DATA:  Acute respiratory failure EXAM: PORTABLE CHEST 1 VIEW COMPARISON:  08/03/2017 FINDINGS: Cardiac shadow remains enlarged. Tracheostomy tube, left jugular central line and feeding catheter are again noted and stable. The lungs are again well aerated. Diffuse vascular congestion is noted with decreasing central in. No new focal infiltrate is seen. No bony abnormality is noted. IMPRESSION: Improving vascular congestion. Electronically Signed   By: Inez Catalina M.D.   On: 08-29-2017 07:03     STUDIES:  CT head 1/23 > No acute intracranial process on this motion and habitus limited examination. Lower face subcutaneous emphysema. EEG 1/23 > markedly abnormal due to diffuse background suppression and lack of EEG reactivity with noxious stimulation. (of note, patient deeply sedated with low dose neuromuscular blockade at the time of the this study)  CULTURES: 1/23 BAL: MSSA 80K  UC 1/23: insig growth.  BC 1/23>>>ng  ANTIBIOTICS: Meropenem 1/23>>>1/26 vanc 1/25>>> 1/26 Ancef 1/26 >>  SIGNIFICANT EVENTS: 1/22 admit to [facility] from [place] with tracheostomy for OHS with chronic resp failure 1/23 cardiac arrest 23 mins after trach coming dislodged.   LINES/TUBES: 8 XLT preadmit > 1/18 (from outside facility)>>>1/23>>>1/23 (JY)>>> ETT (via trach stoma) 1/23 >>>1/23 LIJ CVL 1/23 > Art line 1/23 >   DISCUSSION: 47 year old super morbidly obese male with history of chronic respiratory failure secondary to OHS. He required tracheostomy for this and was transferred to [facility] for rehabilitation. The following day he suffered a resp/ cardiac arrest after his trach had become dislodged. PEA arrest for 23 minutes. He was transferred to ICU at [facility] where he had tracheostomy replaced and was treated  with hypothermia protocol 33C.   ASSESSMENT / PLAN:  Acute on chronic hypoxic/hypercarbic respiratory failure in setting of trach dislodgement and subsequent cardiopulmonary arrest  OHS/OSA  ARDS +/- pulmonary edema  COPD without acute exacerbation Plan Cont full vent support  Wean FIO2 ,keep PEEP 10   PEA arrest: likely primary respiratory arrest. Troponin peaked at 0.1 Persistent circulatory shock: cardiogenic s/p arrest +/- sepsis Atrial fibrillation with RVR - remains sinus here.  CHF probable cor pulmonale reported from recent admission.  H/o HTN Plan Cont to wean levophed  for SBP >110 Cont tele   AKI -seems volume overloaded; cr improving Plan Ct 1/2Ns @ 50/h Renal dose meds NOT  HD  candidate   Hyperkalemia -decreasing hypernatremia -resolved Plan Ct  free water    R/o aspiration vs HCAP -MSSA -dc meropenem/ vanc -use cefazolin istead  Acute anoxic encephalopathy -rewarmed 1/25 EEG 1/23: c/w anoxic encephalopathy  CT head neg  Plan Rass goal 0 PRN fent Await 72 h p-rewarm to prognosticate accurately, neuro assisting  Anemia w/out evidence of bleeding Plan Trend cbc  Insulin dependent diabetes mellitus  Plan ssi  DVT prophylaxis: [place] heparin SUP: ppi  Diet: tubefeed  Activity: BR Disposition : icu  FAMILY  - Updates:  Updated 1/25  - Inter-disciplinary family meet or Palliative Care meeting due by:  1/25 - care limitations placed , DNR, no HD, waiting on them to make decision about vent withdrawal   The patient is critically ill with multiple organ systems failure and requires high complexity decision making for assessment and support, frequent evaluation and titration of therapies, application of advanced monitoring technologies and extensive interpretation of multiple databases. Critical Care Time devoted to patient care services described in this note independent of APP/resident  time is 31 minutes.   [name] Elsworth Soho MD

## 2017-08-11 DEATH — deceased

## 2017-08-29 ENCOUNTER — Telehealth: Payer: Self-pay

## 2017-08-29 NOTE — Telephone Encounter (Signed)
On 08/29/17 I received a d/c from [employer]. The d/c is for cremation. The patient is a patient of Doctor Vassie Lolllva. [facility] for [facility].

## 2017-08-29 NOTE — Telephone Encounter (Signed)
On 08/29/17 I received the d/c back from Doctor Vassie LollAlva. I got the d/c ready and called the funeral home to let them know the d/c was mailed to the funeral home per their request.

## 2017-09-08 NOTE — Discharge Summary (Signed)
yacPULMONARY / CRITICAL CARE MEDICINE   Name: Duane Mcmahon MRN: [id number] DOB: [birth year]    ADMISSION DATE:  07/16/2017 CONSULTATION DATE:  Direct admit from [place] MD:  Dr. Boyce Medici, [facility]  CHIEF COMPLAINT:  Cardiac Arrest   HISTORY OF PRESENT ILLNESS:   HPI obtained from medical chart review as patient is acutely encephalopathic and unable to provide history.    47 [facility] for chronic respiratory failure with hypoxia and hypercarbia, tracheostomy placement, and ventilator dependency.    Has a past medical history of morbid obesity, OSA/OHS, COPD, IDDM, HTN, Afib with RVR, and was a difficult airway at OSH.  During the morning of 1/22, patient had increasing agitation and was given Ativan.  Shortly afterwards he became difficult to ventilate and noted that his 8 XLT trach became dislodged, and patient subsequently developed bradycardia changing to PEA/ asystole.  CPR provided between 0548 - 0611 with ACLS measures with conversion to sinus tachycardia.  His tracheotomy was unable to be placed back.  A 6.5 ETT was placed in his stoma.  After ROSC, patient remained unresponsive.  PCCM called for direct admit to ICU following cardiac arrest.    Prior to cardiac arrest, patient was on fentanyl drip at 100 mcg/hr and cardizem drip at 10 mg/hr.  His baseline mental status was alert and able to follow commands and move all extremities.  His ventilator settings were AC 500/ 20/ 40%/5.      STUDIES:  CT head 1/23 > No acute intracranial process on this motion and habitus limited examination. Lower face subcutaneous emphysema. EEG 1/23 > markedly abnormal due to diffuse background suppression and lack of EEG reactivity with noxious stimulation. (of note, patient deeply sedated with low dose neuromuscular blockade at the time of the this study)  CULTURES: 1/23 BAL: MSSA 80K  UC 1/23:  insig growth.  BC 1/23>>>ng  ANTIBIOTICS: Meropenem 1/23>>>1/26 vanc 1/25>>> 1/26 Ancef 1/26 >>  SIGNIFICANT EVENTS: 1/22 admit to [facility] from [place] with tracheostomy for OHS with chronic resp failure 1/23 cardiac arrest 23 mins after trach coming dislodged.   LINES/TUBES: 8 XLT preadmit > 1/18 (from outside facility)>>>1/23>>>1/23 (JY)>>> ETT (via trach stoma) 1/23 >>>1/23 LIJ CVL 1/23 > Art line 1/23 >   COURSE: 47 year old super morbidly obese male with history of chronic respiratory failure secondary to OHS. He required tracheostomy for this and was transferred to [facility] for rehabilitation. The following day he suffered a resp/ cardiac arrest after his trach had become dislodged. PEA arrest for 23 minutes. He was transferred to ICU at [facility] where he had tracheostomy replaced and was treated with hypothermia protocol 33C.  He developed renal failure and did not have neurological recovery.  Poor prognosis conveyed to family and based on his known prior wishes, life support was withdrawn.  He passed away on 16-Aug-2022   Cause of death-chronic respiratory failure, history hypoventilation, tracheostomy, diabetes  [name] MD. FCCP. [facility] Pager [phone] If no response call [phone]   08/14/2017   [name]. Elsworth Soho MD

## 2019-07-07 IMAGING — CT CT HEAD W/O CM
3 series · 14 of 47 positions shown, 16 images · non-contrast
Comparison: None.

CLINICAL DATA: Cardiac arrest after removal of tracheostomy tube.

EXAM:
CT HEAD WITHOUT CONTRAST
TECHNIQUE: Contiguous axial images were obtained from the base of the skull
through the vertex without intravenous contrast.

[Series 3: head 5.0 h31s · axial · 0.55mm/px · z∈[+631,+761]mm · 8 of 31 slices shown, 10 images]
[im 3/31  brain]
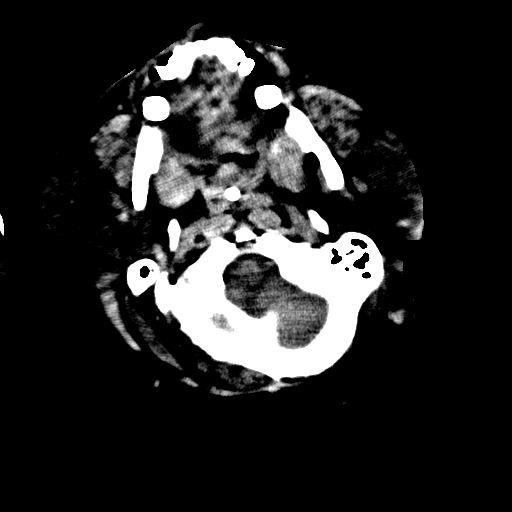
[im 3/31  bone]
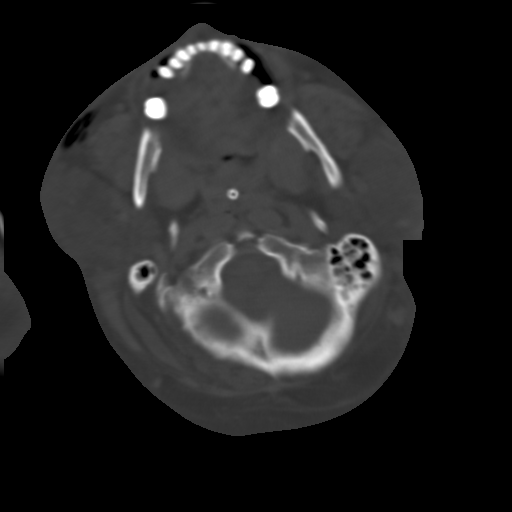
[im 7/31  brain]
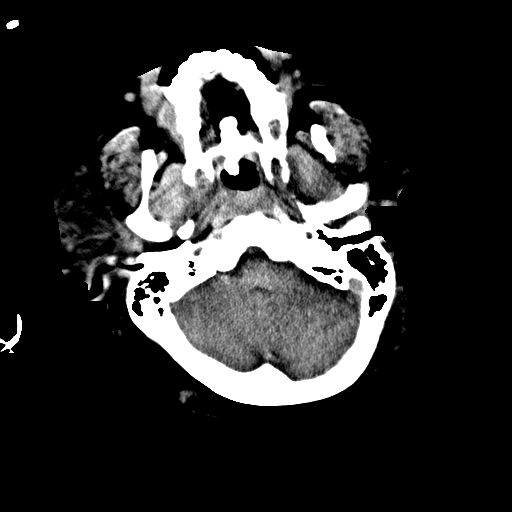
[im 10/31  brain]
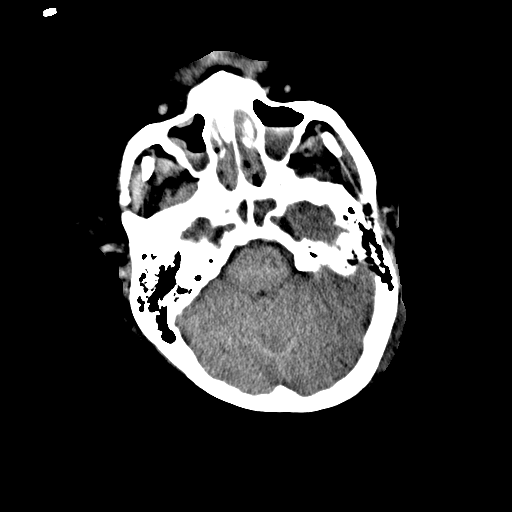
[im 14/31  brain]
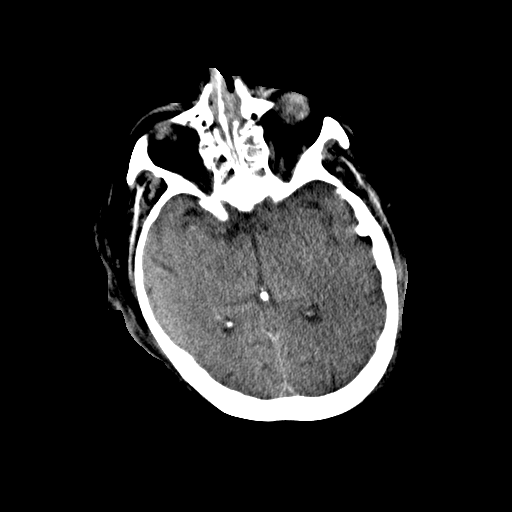
[im 17/31  brain]
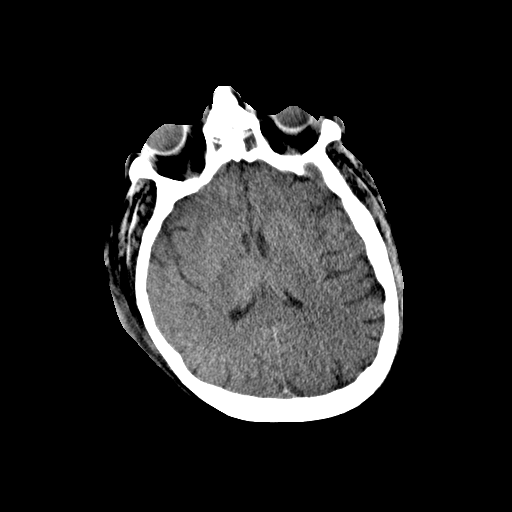
[im 17/31  bone]
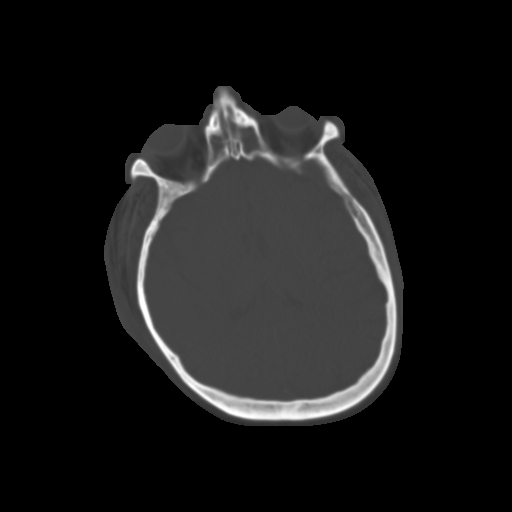
[im 21/31  brain]
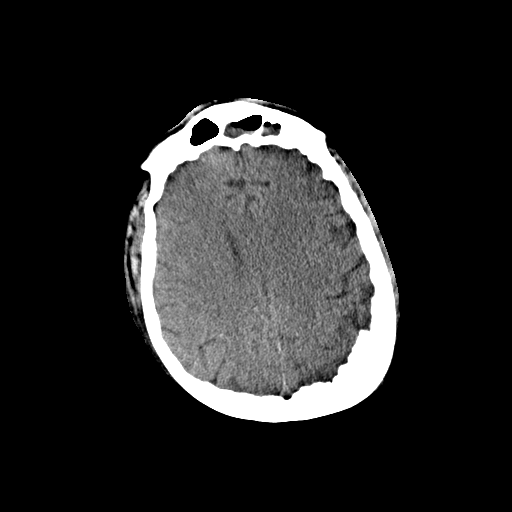
[im 24/31  brain]
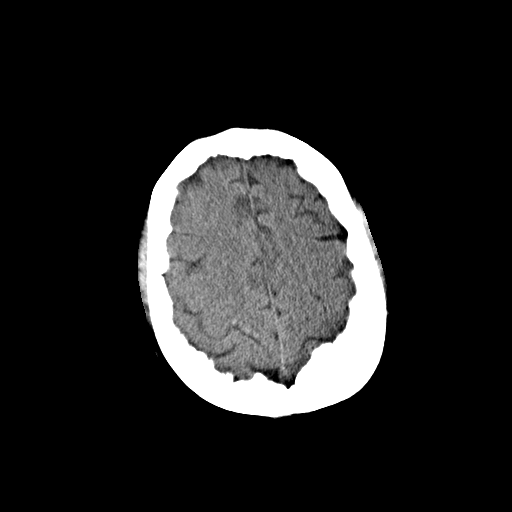
[im 28/31  brain]
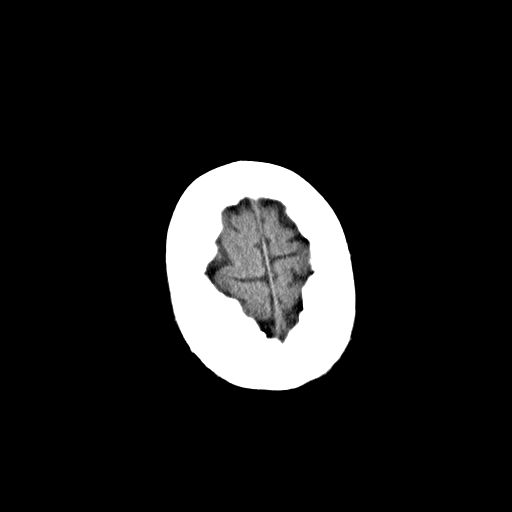

[Series 5: head 3.0 mpr cor · coronal · 0.31mm/px · 3 of 65 slices shown]
[im 23/65  brain]
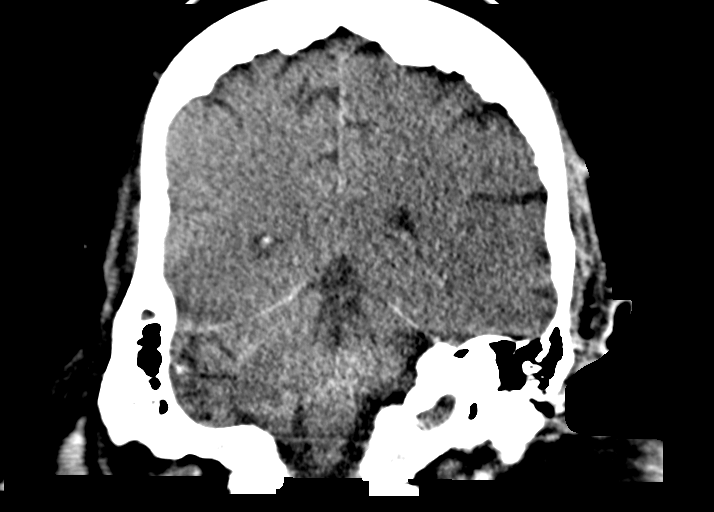
[im 29/65  brain]
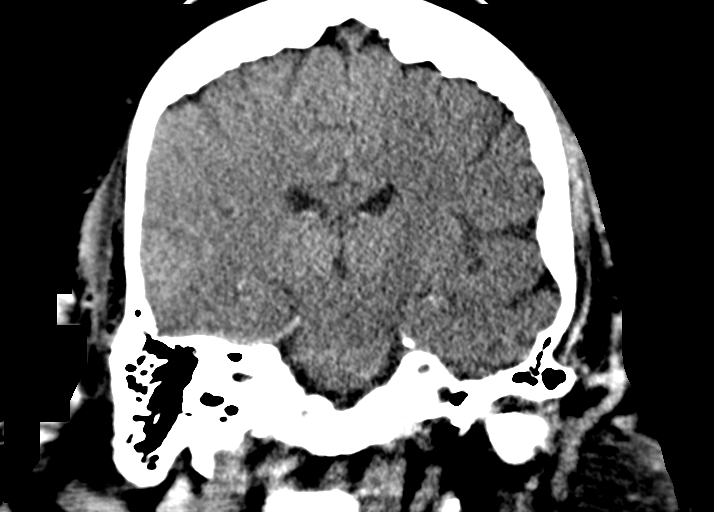
[im 36/65  brain]
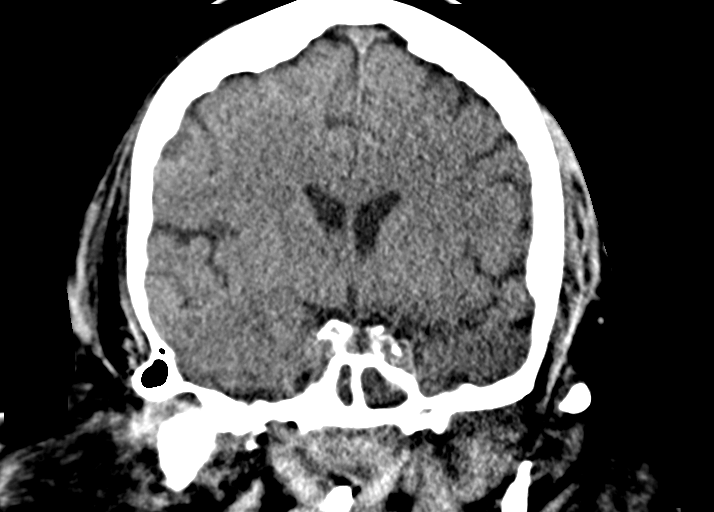

[Series 6: head 3.0 mpr sag · sagittal · 0.31mm/px · 3 of 55 slices shown]
[im 19/55  brain]
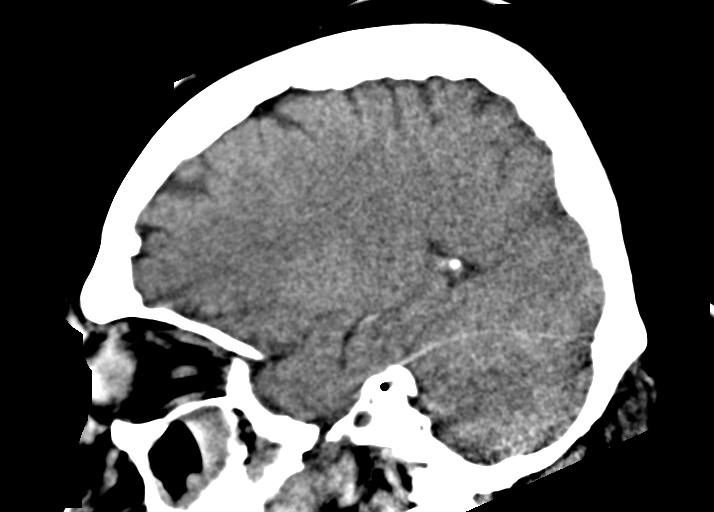
[im 28/55  brain]
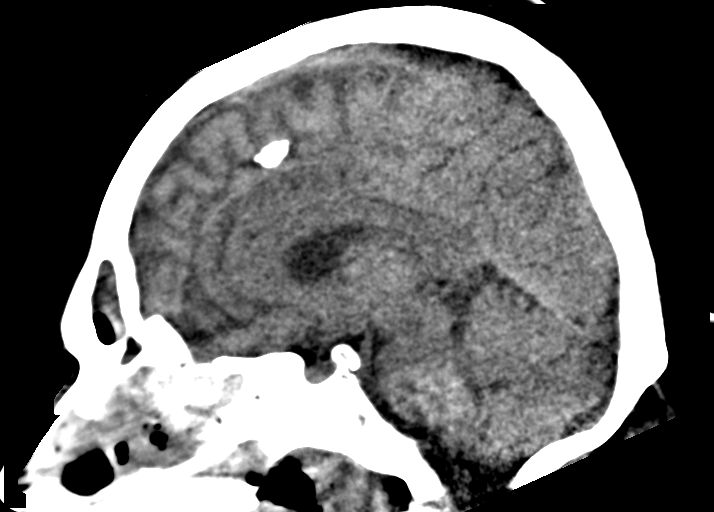
[im 37/55  brain]
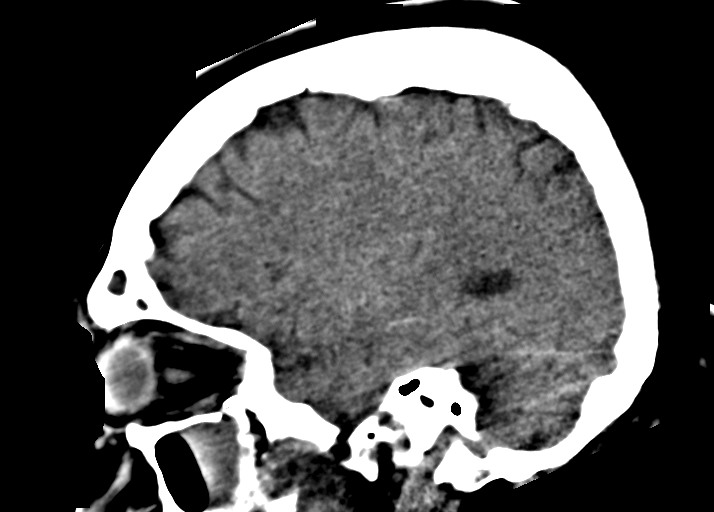

[14 of 47 positions shown; findings below may reference images not displayed]

FINDINGS: Large body habitus results in overall noisy image quality. Mild
motion degraded examination.

BRAIN: No intraparenchymal hemorrhage, mass effect nor midline
shift. The ventricles and sulci are normal. No acute large vascular
territory infarcts. No abnormal extra-axial fluid collections. Basal
cisterns are patent.

VASCULAR: Mild calcific atherosclerosis.

SKULL/SOFT TISSUES: No skull fracture. Multifocal LEFT hyperostosis
frontalis internus. No significant soft tissue swelling.

ORBITS/SINUSES: The included ocular globes and orbital contents are
normal.Severe pan paranasal sinusitis. Bilateral mastoid effusions
without air cell coalescence.

OTHER: Small volume bilateral lower face subcutaneous gas.
IMPRESSION: 1. No acute intracranial process on this motion and habitus limited
examination.
2. Lower face subcutaneous emphysema.

## 2019-07-09 IMAGING — CT CT HEAD W/O CM
4 series · 17 of 47 positions shown, 19 images · non-contrast
Comparison: Head CT 08/02/2017

CLINICAL DATA: Suspected hypoxic anoxic brain injury

EXAM:
CT HEAD WITHOUT CONTRAST
TECHNIQUE: Contiguous axial images were obtained from the base of the skull
through the vertex without intravenous contrast.

[Series 3: head wo · axial · 0.39mm/px · z∈[-575,-450]mm · 6 of 36 slices shown, 8 images]
[im 6/36  brain]
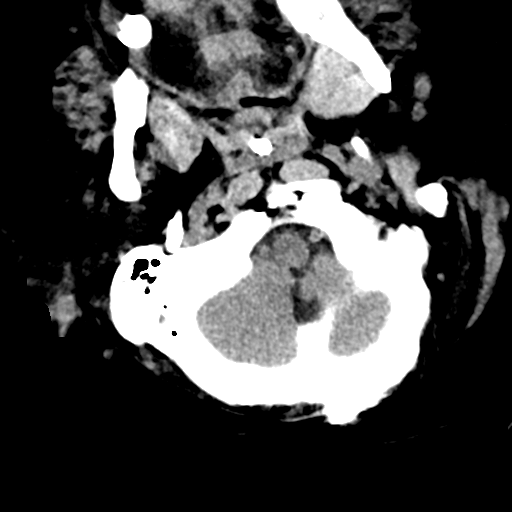
[im 6/36  bone]
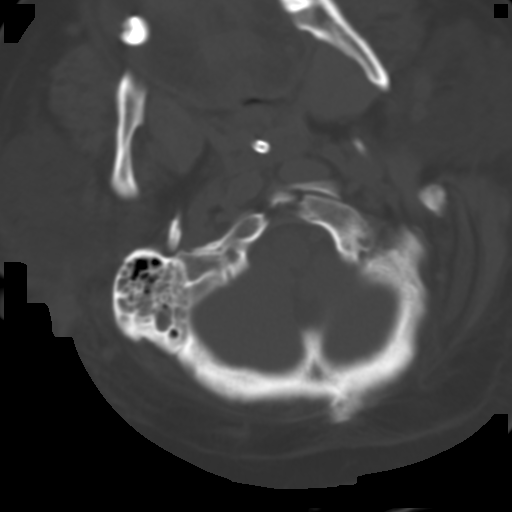
[im 11/36  brain]
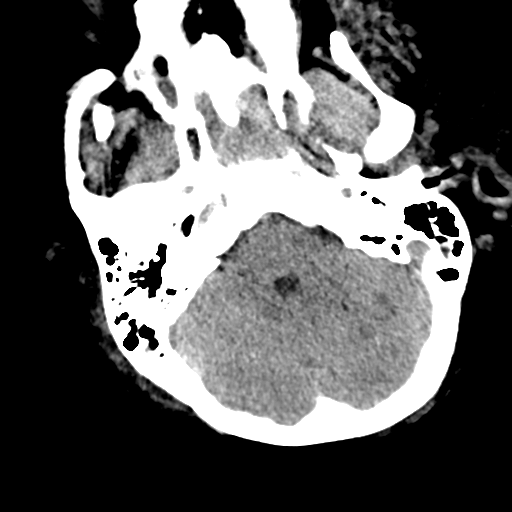
[im 16/36  brain]
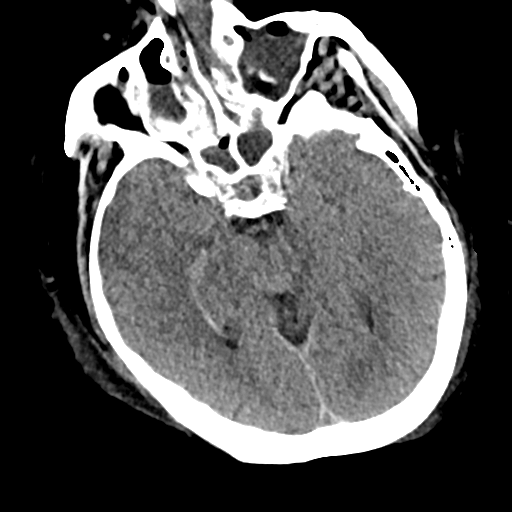
[im 21/36  brain]
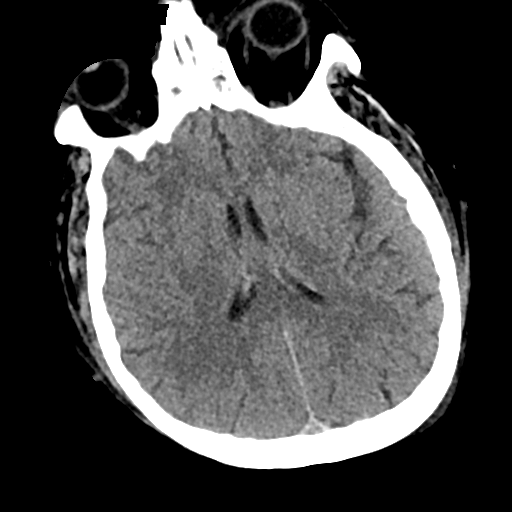
[im 26/36  brain]
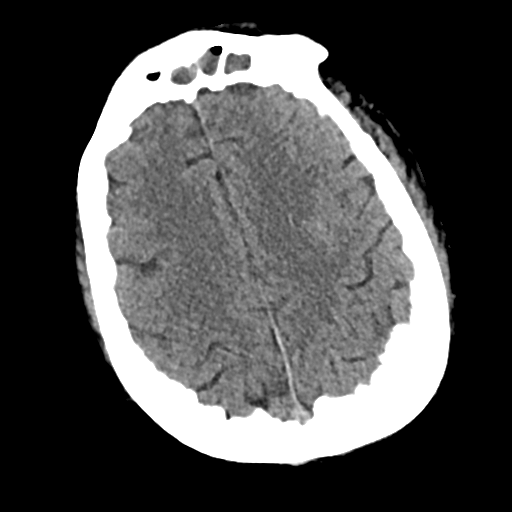
[im 26/36  bone]
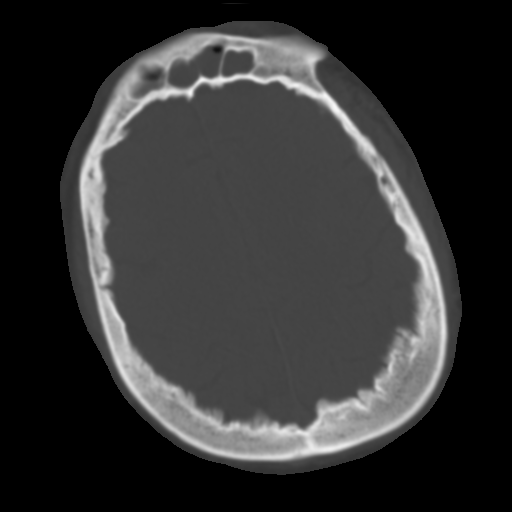
[im 31/36  brain]
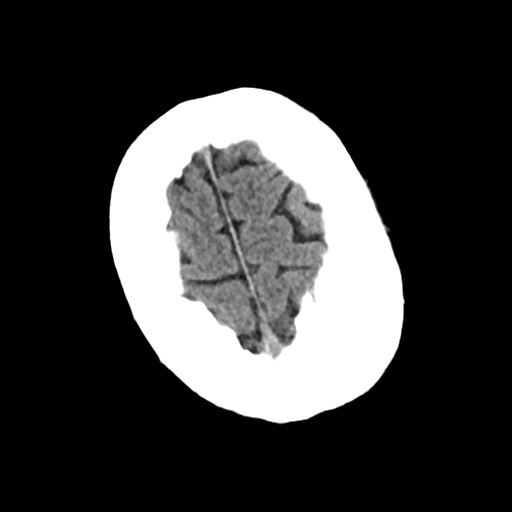

[Series 4: head bone · axial · 0.39mm/px · z∈[-584,-496]mm · 5 of 92 slices shown]
[im 9/92  bone]
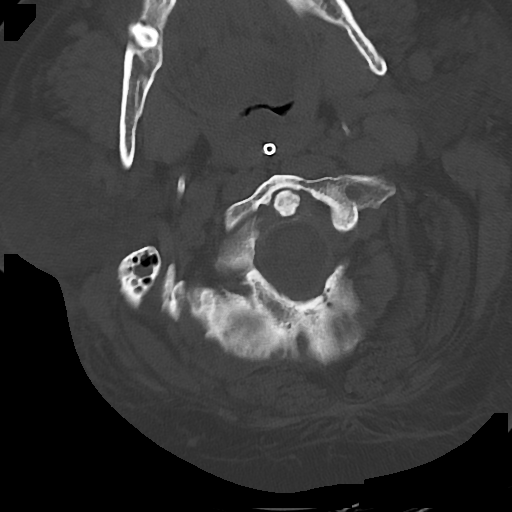
[im 18/92  bone]
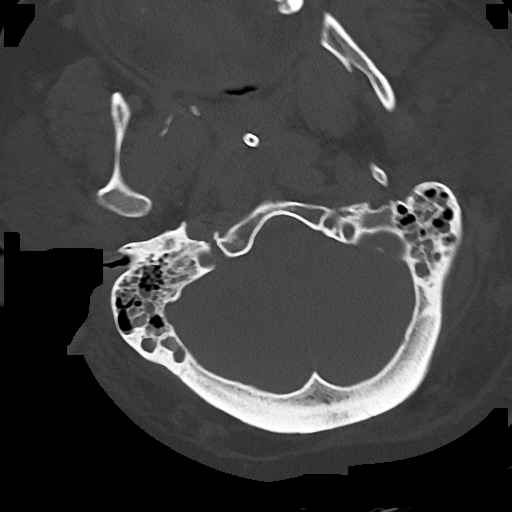
[im 31/92  bone]
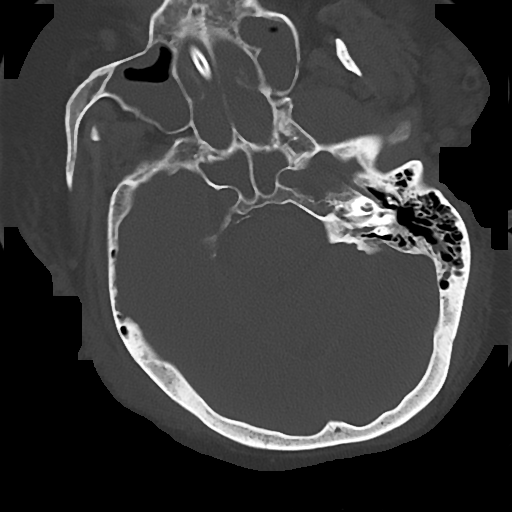
[im 40/92  bone]
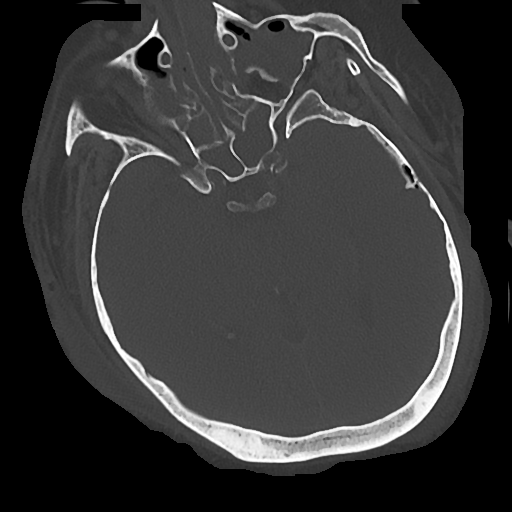
[im 53/92  bone]
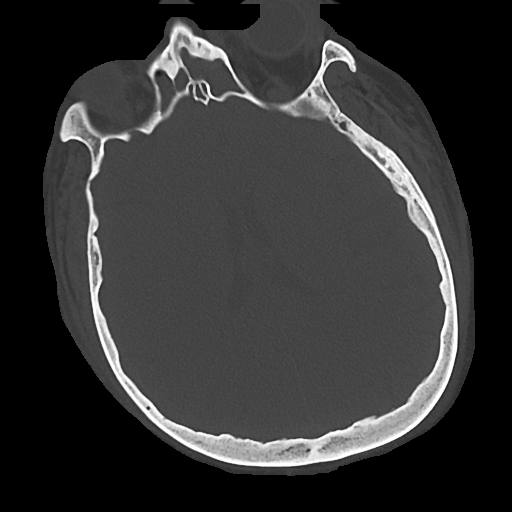

[Series 5: cor soft · coronal · 0.36mm/px · 3 of 69 slices shown]
[im 23/69  brain]
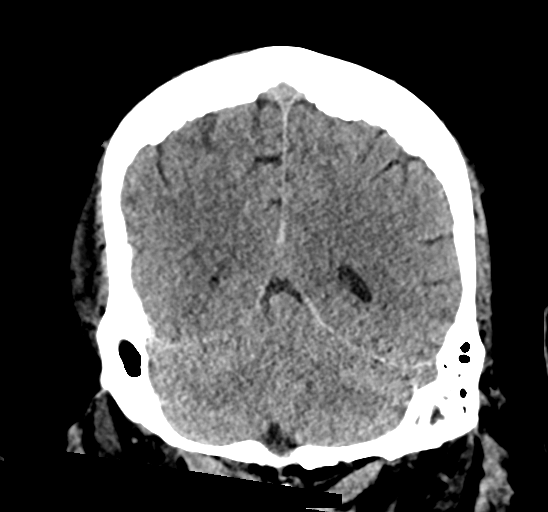
[im 31/69  brain]
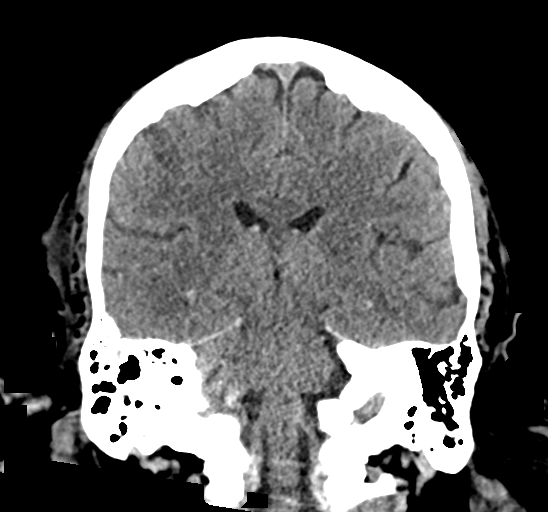
[im 38/69  brain]
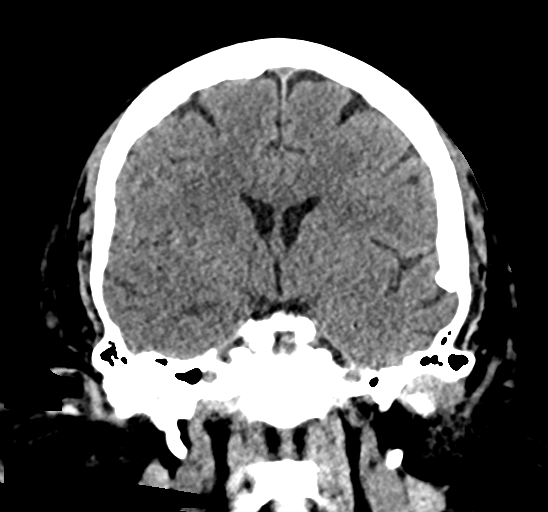

[Series 6: sag soft · sagittal · 0.36mm/px · 3 of 67 slices shown]
[im 23/67  brain]
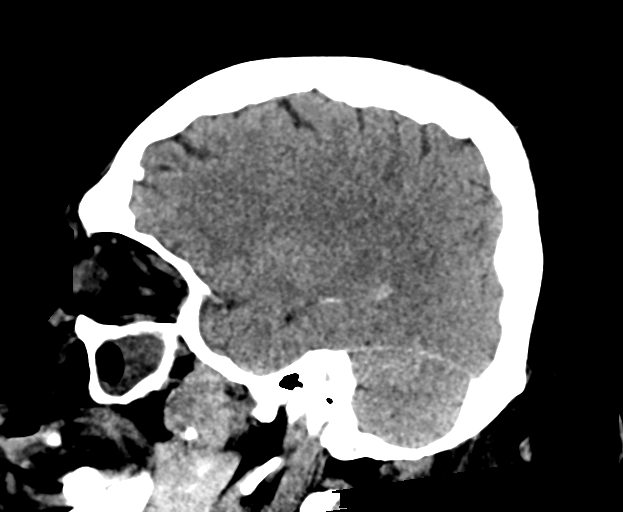
[im 34/67  brain]
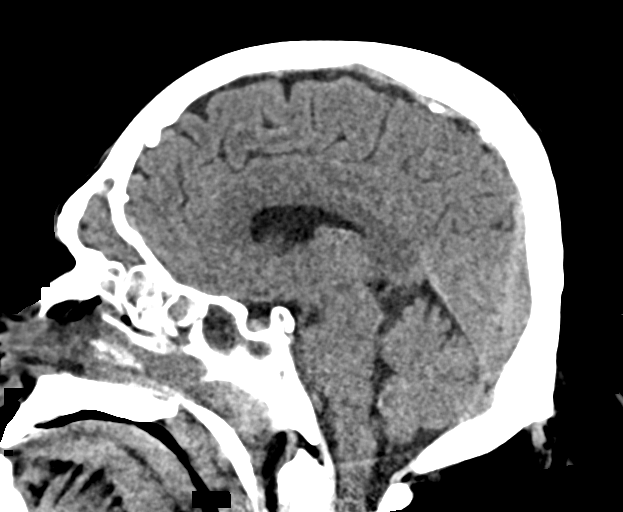
[im 44/67  brain]
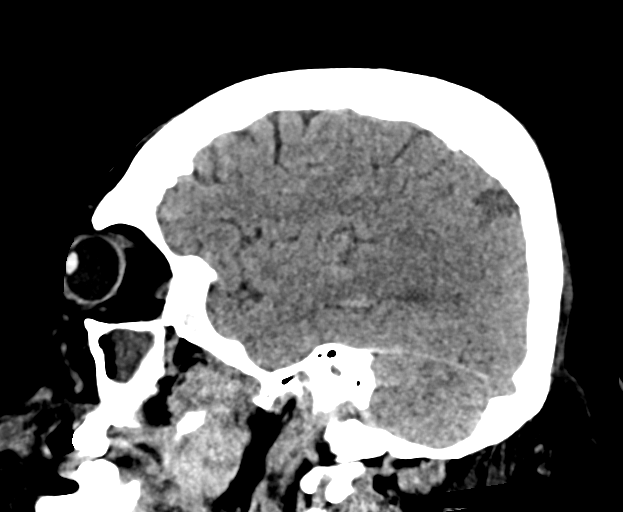

[17 of 47 positions shown; findings below may reference images not displayed]

FINDINGS: Brain: No mass lesion, intraparenchymal hemorrhage or extra-axial
collection. No evidence of acute cortical infarct. Brain parenchyma
and CSF-containing spaces are normal for age.

Vascular: No hyperdense vessel or unexpected calcification.

Skull: Normal visualized skull base, calvarium and extracranial soft
tissues.

Sinuses/Orbits: Bilateral mastoid effusions. Near complete
opacification of the visualized paranasal sinuses. Normal orbits.
IMPRESSION: No acute intracranial abnormality.
